# Patient Record
Sex: Male | Born: 1980 | Race: White | Hispanic: No | Marital: Married | Smoking: Current every day smoker
Health system: Southern US, Community
[De-identification: ages and names within clinical notes are randomized; demographics above are authoritative.]

## PROBLEM LIST (undated history)

## (undated) DIAGNOSIS — J45909 Unspecified asthma, uncomplicated: Secondary | ICD-10-CM

## (undated) DIAGNOSIS — F988 Other specified behavioral and emotional disorders with onset usually occurring in childhood and adolescence: Secondary | ICD-10-CM

## (undated) DIAGNOSIS — D229 Melanocytic nevi, unspecified: Secondary | ICD-10-CM

## (undated) DIAGNOSIS — K469 Unspecified abdominal hernia without obstruction or gangrene: Secondary | ICD-10-CM

## (undated) HISTORY — PX: HERNIA REPAIR: SHX51

## (undated) HISTORY — PX: KNEE SURGERY: SHX244

## (undated) HISTORY — DX: Melanocytic nevi, unspecified: D22.9

---

## 1998-05-14 ENCOUNTER — Emergency Department (HOSPITAL_COMMUNITY): Admission: EM | Admit: 1998-05-14 | Discharge: 1998-05-14 | Payer: Self-pay | Admitting: Emergency Medicine

## 1998-07-29 ENCOUNTER — Emergency Department (HOSPITAL_COMMUNITY): Admission: EM | Admit: 1998-07-29 | Discharge: 1998-07-29 | Payer: Self-pay | Admitting: Emergency Medicine

## 1999-06-04 ENCOUNTER — Emergency Department (HOSPITAL_COMMUNITY): Admission: EM | Admit: 1999-06-04 | Discharge: 1999-06-04 | Payer: Self-pay | Admitting: Emergency Medicine

## 1999-06-04 ENCOUNTER — Encounter: Payer: Self-pay | Admitting: Emergency Medicine

## 1999-06-10 ENCOUNTER — Encounter: Payer: Self-pay | Admitting: Orthopedic Surgery

## 1999-06-10 ENCOUNTER — Ambulatory Visit (HOSPITAL_COMMUNITY): Admission: RE | Admit: 1999-06-10 | Discharge: 1999-06-10 | Payer: Self-pay | Admitting: Orthopedic Surgery

## 2002-06-02 ENCOUNTER — Emergency Department (HOSPITAL_COMMUNITY): Admission: EM | Admit: 2002-06-02 | Discharge: 2002-06-03 | Payer: Self-pay | Admitting: Emergency Medicine

## 2002-06-03 ENCOUNTER — Encounter: Payer: Self-pay | Admitting: Emergency Medicine

## 2003-12-21 ENCOUNTER — Emergency Department (HOSPITAL_COMMUNITY): Admission: EM | Admit: 2003-12-21 | Discharge: 2003-12-22 | Payer: Self-pay | Admitting: Emergency Medicine

## 2004-03-13 ENCOUNTER — Emergency Department (HOSPITAL_COMMUNITY): Admission: EM | Admit: 2004-03-13 | Discharge: 2004-03-13 | Payer: Self-pay | Admitting: Emergency Medicine

## 2004-11-02 ENCOUNTER — Ambulatory Visit (HOSPITAL_COMMUNITY): Admission: RE | Admit: 2004-11-02 | Discharge: 2004-11-02 | Payer: Self-pay | Admitting: Internal Medicine

## 2008-01-05 ENCOUNTER — Emergency Department (HOSPITAL_COMMUNITY): Admission: EM | Admit: 2008-01-05 | Discharge: 2008-01-05 | Payer: Self-pay | Admitting: Emergency Medicine

## 2008-02-03 ENCOUNTER — Emergency Department (HOSPITAL_COMMUNITY): Admission: EM | Admit: 2008-02-03 | Discharge: 2008-02-03 | Payer: Self-pay | Admitting: Emergency Medicine

## 2008-02-16 ENCOUNTER — Emergency Department (HOSPITAL_COMMUNITY): Admission: EM | Admit: 2008-02-16 | Discharge: 2008-02-16 | Payer: Self-pay | Admitting: Emergency Medicine

## 2008-03-31 ENCOUNTER — Ambulatory Visit: Payer: Self-pay | Admitting: Internal Medicine

## 2009-08-13 ENCOUNTER — Emergency Department (HOSPITAL_COMMUNITY): Admission: EM | Admit: 2009-08-13 | Discharge: 2009-08-13 | Payer: Self-pay | Admitting: Emergency Medicine

## 2010-06-21 ENCOUNTER — Ambulatory Visit: Payer: Self-pay | Admitting: Internal Medicine

## 2010-06-21 DIAGNOSIS — J45909 Unspecified asthma, uncomplicated: Secondary | ICD-10-CM | POA: Insufficient documentation

## 2010-06-21 DIAGNOSIS — J309 Allergic rhinitis, unspecified: Secondary | ICD-10-CM | POA: Insufficient documentation

## 2010-06-21 DIAGNOSIS — K4090 Unilateral inguinal hernia, without obstruction or gangrene, not specified as recurrent: Secondary | ICD-10-CM | POA: Insufficient documentation

## 2010-06-21 DIAGNOSIS — F411 Generalized anxiety disorder: Secondary | ICD-10-CM | POA: Insufficient documentation

## 2010-06-21 DIAGNOSIS — F17200 Nicotine dependence, unspecified, uncomplicated: Secondary | ICD-10-CM | POA: Insufficient documentation

## 2010-06-21 LAB — CONVERTED CEMR LAB
AST: 25 units/L (ref 0–37)
Albumin: 4.3 g/dL (ref 3.5–5.2)
Alkaline Phosphatase: 96 units/L (ref 39–117)
Basophils Relative: 0.7 % (ref 0.0–3.0)
CO2: 31 meq/L (ref 19–32)
Calcium: 9.5 mg/dL (ref 8.4–10.5)
Chloride: 105 meq/L (ref 96–112)
Eosinophils Absolute: 0.1 10*3/uL (ref 0.0–0.7)
HCT: 42.5 % (ref 39.0–52.0)
Hemoglobin: 14.9 g/dL (ref 13.0–17.0)
Lymphocytes Relative: 40.5 % (ref 12.0–46.0)
MCHC: 35.2 g/dL (ref 30.0–36.0)
Neutro Abs: 3.4 10*3/uL (ref 1.4–7.7)
Potassium: 3.8 meq/L (ref 3.5–5.1)
RBC: 4.7 M/uL (ref 4.22–5.81)
Sodium: 142 meq/L (ref 135–145)
Total Protein: 7.5 g/dL (ref 6.0–8.3)

## 2010-08-02 ENCOUNTER — Emergency Department (HOSPITAL_COMMUNITY): Admission: EM | Admit: 2010-08-02 | Discharge: 2010-08-03 | Payer: Self-pay | Admitting: Emergency Medicine
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
## 2010-10-12 NOTE — Letter (Signed)
Summary: Results Follow-up Letter  [facility]  [address] [place], [place] [address]   Phone: [phone]  Fax: [phone]    06/21/2010  [address] [place], [place]  [address]  Dear Mr. Goedde,   The following are the results of your recent test(s):  Test     Result     Liver/kidney   normal CBC       normal Thyroid     normal   _________________________________________________________  Please call for an appointment as directed _________________________________________________________ _________________________________________________________ _________________________________________________________  Sincerely,  Sanda Linger MD [facility]

## 2010-11-12 ENCOUNTER — Emergency Department (HOSPITAL_BASED_OUTPATIENT_CLINIC_OR_DEPARTMENT_OTHER)
Admission: EM | Admit: 2010-11-12 | Discharge: 2010-11-12 | Disposition: A | Discharge status: Home / Self Care | Payer: Self-pay | Attending: Emergency Medicine | Admitting: Emergency Medicine

## 2010-11-12 ENCOUNTER — Emergency Department (INDEPENDENT_AMBULATORY_CARE_PROVIDER_SITE_OTHER): Payer: Self-pay

## 2010-11-12 DIAGNOSIS — S0003XA Contusion of scalp, initial encounter: Secondary | ICD-10-CM | POA: Insufficient documentation

## 2010-11-12 DIAGNOSIS — S63509A Unspecified sprain of unspecified wrist, initial encounter: Secondary | ICD-10-CM | POA: Insufficient documentation

## 2010-11-12 DIAGNOSIS — S0083XA Contusion of other part of head, initial encounter: Secondary | ICD-10-CM | POA: Insufficient documentation

## 2010-11-12 DIAGNOSIS — F17200 Nicotine dependence, unspecified, uncomplicated: Secondary | ICD-10-CM | POA: Insufficient documentation

## 2010-11-12 DIAGNOSIS — S060X0A Concussion without loss of consciousness, initial encounter: Secondary | ICD-10-CM | POA: Insufficient documentation

## 2010-11-12 DIAGNOSIS — J069 Acute upper respiratory infection, unspecified: Secondary | ICD-10-CM | POA: Insufficient documentation

## 2010-11-12 DIAGNOSIS — M25539 Pain in unspecified wrist: Secondary | ICD-10-CM

## 2010-11-23 LAB — DIC (DISSEMINATED INTRAVASCULAR COAGULATION)PANEL
INR: 0.93 (ref 0.00–1.49)
Prothrombin Time: 12.7 seconds (ref 11.6–15.2)
Smear Review: NONE SEEN

## 2010-11-23 LAB — BASIC METABOLIC PANEL
BUN: 12 mg/dL (ref 6–23)
Calcium: 9.4 mg/dL (ref 8.4–10.5)
GFR calc non Af Amer: >60 mL/min (ref >60)
Glucose, Bld: 100 mg/dL — ABNORMAL HIGH (ref 70–99)
Sodium: 140 mEq/L (ref 135–145)

## 2010-11-23 LAB — CBC
MCHC: 34 g/dL (ref 30.0–36.0)
Platelets: 225 10*3/uL (ref 150–400)
RDW: 13.8 % (ref 11.5–15.5)

## 2010-11-23 LAB — DIFFERENTIAL
Basophils Absolute: 0 10*3/uL (ref 0.0–0.1)
Basophils Relative: 0 % (ref 0–1)
Neutro Abs: 6.4 10*3/uL (ref 1.7–7.7)
Neutrophils Relative %: 67 % (ref 43–77)

## 2010-11-23 LAB — POCT CARDIAC MARKERS
CKMB, poc: <1 ng/mL — ABNORMAL LOW (ref 1.0–8.0)
CKMB, poc: <1 ng/mL — ABNORMAL LOW (ref 1.0–8.0)
Myoglobin, poc: 30.3 ng/mL (ref 12–200)
Troponin i, poc: <0.05 ng/mL (ref 0.00–0.09)

## 2011-01-25 NOTE — Assessment & Plan Note (Signed)
[facility]                            CARDIOLOGY OFFICE NOTE   CEDRIK, HEINDL                  MRN:          [id number]  DATE:03/31/2008                            DOB:          12/15/1980    IDENTIFICATION:  Mr. Lupton is a 30 year old who is self-referred for  evaluation of chest pain.   HISTORY OF PRESENT ILLNESS:  The patient was recently seen in the [facility]  Emergency Room for chest pain on March 07, 2008.  He notes that the pain  started about 9 hours before arriving to the ER.  It was  acute and  persistent.  At its maximum was 9/10 in intensity.  This lasted several  minutes and he is down to 5/5.  He did say that the pain was pleuritic.  He thinks that taking a deep breath made it worse.  The patient was seen  and evaluated, and sent home with a diagnosis of pleurisy.  Since that  time, he has had one other episode last week not as severe.  The patient  had some throbbing left arm pain not associated with chest pain.  Sometimes he will drop things secondary to discomfort.   The patient currently is active.  He runs 2-3 times per week 1.5 miles  over a 17 minute period and has not had any change in this recently.  Denies chest pain during his running.  He gets a little pressure after.   ALLERGIES:  NONE.   MEDICATIONS:  Vitamin D.   PAST MEDICAL HISTORY:  1. Seasonal allergies.  2. Anxiety.   SOCIAL HISTORY:  The patient is married, has one child.  Smokes about 5-  6 cigarettes per day for the past 9 years.  Several years ago, he used  to smoke about 2 packs per day.  Drinks a couple times per week.   FAMILY HISTORY:  Question maternal grandfather with CAD.  Family history  is not well known.   REVIEW OF SYSTEMS:  All systems reviewed negative to the above problem  except as noted above.  Cholesterol unknown.   PHYSICAL EXAMINATION:  GENERAL:  The patient is in no acute distress.  VITAL SIGNS:  Blood pressure is 108/69,  pulse 67, weight 129.  HEENT:  Normocephalic, atraumatic, EOMI, PERL.  Mucous membranes are  moist.  NECK:  JVP is normal.  No thyromegaly or bruits.  LUNGS:  Clear without rales or wheezes.  CHEST:  Tender on palpation, left parasternal particularly.  Pain is  different though than what he came into the ER with.  CARDIAC:  Regular rate and rhythm, S1-S2, no S3-S4, murmurs or clicks.  ABDOMEN:  Supple, nontender.  Normal bowel sounds.  No masses.  EXTREMITIES:  Good distal pulses.  No lower extremity edema.   DIAGNOSTICS:  A 12-lead EKG:  Normal sinus rhythm, 65 beats per minute.   IMPRESSION:  Mr. Platten is a 30 year old gentleman with chest pain.  He  had 2 bad spells.  He was seen in the emergency room for the worst and  was told he had pleurisy.  Indeed, his pain is  very atypical for  cardiac.  He runs 3 times per week and has not had a problem running, no  change in his pace, no pain during the runs.  I tend to agree that his  pain is noncardiac.   For now, I would recommend risk factor modification.  He denies reflux.  He is an active smoker and I have counseled him at length about smoking  cessation.  Indeed this may have aggravated his presentation.  I would  get a fasting lipid panel.   The patient does note and I did not state this in the review of systems,  that he snores a lot, he is tired during the day, felt like he has never  gotten good rest.  I will therefore set him up for a sleep study to  evaluate.  He does have a history of a deviated septum.  On review of  his oropharynx, there does not appear to be any significant obstruction.   I have not set a definite followup, but will be in touch with him  regarding the test results.     Pricilla Riffle, MD, [facility]  Electronically Signed    PVR/MedQ  DD: 04/01/2008  DT: 04/01/2008  Job #: [id number]

## 2011-06-08 LAB — COMPREHENSIVE METABOLIC PANEL
AST: 30
BUN: 10
CO2: 25
Calcium: 9.6
Creatinine, Ser: 0.87
GFR calc Af Amer: >60
GFR calc non Af Amer: >60

## 2011-06-08 LAB — DIFFERENTIAL
Basophils Absolute: 0
Basophils Relative: 0
Eosinophils Absolute: 0.1
Monocytes Absolute: 0.4
Monocytes Relative: 4

## 2011-06-08 LAB — CBC
HCT: 43.2
MCHC: 34.3
MCV: 90
RBC: 4.8

## 2011-06-09 LAB — D-DIMER, QUANTITATIVE: D-Dimer, Quant: <0.22

## 2011-06-09 LAB — POCT I-STAT, CHEM 8
BUN: 17
Calcium, Ion: 1.15
Chloride: 109
Creatinine, Ser: 0.9
Glucose, Bld: 86
HCT: 44
Hemoglobin: 15
Potassium: 3.5
Sodium: 141
TCO2: 23

## 2011-06-09 LAB — POCT CARDIAC MARKERS
CKMB, poc: <1 — ABNORMAL LOW
CKMB, poc: <1 — ABNORMAL LOW
Myoglobin, poc: 27
Myoglobin, poc: 37.4
Operator id: 282201
Operator id: 282201
Troponin i, poc: <0.05
Troponin i, poc: <0.05

## 2011-06-09 LAB — CBC
MCHC: 35.7
Platelets: 260
RDW: 13.6

## 2011-06-09 LAB — DIFFERENTIAL
Basophils Absolute: 0.1
Basophils Relative: 1
Neutro Abs: 3.9
Neutrophils Relative %: 50

## 2011-08-01 DIAGNOSIS — R000 Tachycardia, unspecified: Secondary | ICD-10-CM | POA: Insufficient documentation

## 2011-08-01 DIAGNOSIS — F1010 Alcohol abuse, uncomplicated: Secondary | ICD-10-CM | POA: Insufficient documentation

## 2011-08-02 ENCOUNTER — Encounter: Payer: Self-pay | Admitting: *Deleted

## 2011-08-02 ENCOUNTER — Emergency Department (HOSPITAL_COMMUNITY)
Admission: EM | Admit: 2011-08-02 | Discharge: 2011-08-02 | Disposition: A | Discharge status: Home / Self Care | Payer: Self-pay | Attending: Emergency Medicine | Admitting: Emergency Medicine

## 2011-08-02 DIAGNOSIS — F10929 Alcohol use, unspecified with intoxication, unspecified: Secondary | ICD-10-CM

## 2011-08-02 LAB — POCT I-STAT, CHEM 8
Creatinine, Ser: 1.2 mg/dL (ref 0.50–1.35)
Hemoglobin: 15.6 g/dL (ref 13.0–17.0)
Potassium: 3.5 mEq/L (ref 3.5–5.1)
Sodium: 144 mEq/L (ref 135–145)

## 2011-08-02 MED ORDER — SODIUM CHLORIDE 0.9 % IV BOLUS (SEPSIS)
1000.0000 mL | Freq: Once | INTRAVENOUS | Status: AC
  Administered 2011-08-02: 1000 mL via INTRAVENOUS
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
## 2011-08-02 NOTE — ED Notes (Signed)
Talked with Dr Manus Gunning, informed him that patient does not have a ride home. Informed that patient was alert and awake. Ok'd to proceed with discharge.

## 2011-08-02 NOTE — ED Notes (Signed)
Ambulated to xray without difficulty Resp in room for treatments.

## 2011-08-02 NOTE — ED Notes (Signed)
No complaints at present. Bus pass given
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
## 2011-08-05 ENCOUNTER — Telehealth: Payer: Self-pay | Admitting: *Deleted

## 2011-08-05 NOTE — Telephone Encounter (Signed)
Informed pt per voicemail that he will need appointment with Dr Yetta Barre

## 2011-08-05 NOTE — Telephone Encounter (Signed)
Pt called and states he was seen at ER, he states he was given Klonipin and needs prescription for this medication. I looked at hospital note but cant tell if pt was actually given RX at ER or if it was just administer by hospital while he was there. Please Advise in Dr Yetta Barre absence

## 2011-08-05 NOTE — Telephone Encounter (Signed)
Pt has been new patient of Dr Yetta Barre since October 2011;  I can find no evidence of actual prior prescription for klonopin, though this was listed on centricity as a medication, and no refill on EPIC as well  Since this is apparently not an routine refilled med monitored by Dr Yetta Barre, I would not feel comfortable prescribing a "new" medication for this pt

## 2011-10-02 ENCOUNTER — Emergency Department (HOSPITAL_BASED_OUTPATIENT_CLINIC_OR_DEPARTMENT_OTHER)
Admission: EM | Admit: 2011-10-02 | Discharge: 2011-10-02 | Disposition: A | Discharge status: Home / Self Care | Payer: Self-pay | Attending: Emergency Medicine | Admitting: Emergency Medicine

## 2011-10-02 ENCOUNTER — Encounter (HOSPITAL_BASED_OUTPATIENT_CLINIC_OR_DEPARTMENT_OTHER): Payer: Self-pay | Admitting: *Deleted

## 2011-10-02 DIAGNOSIS — N509 Disorder of male genital organs, unspecified: Secondary | ICD-10-CM | POA: Insufficient documentation

## 2011-10-02 DIAGNOSIS — F17200 Nicotine dependence, unspecified, uncomplicated: Secondary | ICD-10-CM | POA: Insufficient documentation

## 2011-10-02 DIAGNOSIS — K4090 Unilateral inguinal hernia, without obstruction or gangrene, not specified as recurrent: Secondary | ICD-10-CM | POA: Insufficient documentation

## 2011-10-02 HISTORY — DX: Unspecified abdominal hernia without obstruction or gangrene: K46.9

## 2011-10-02 NOTE — ED Notes (Signed)
Pt states that he has had a R inguinal hernia for several years and over the past few months it has been getting more painful.  However, this past Thursday, while shoveling snow, pt states that the hernia has become larger and acutely painful.  Pt states that pain radiates down into his testicles and that he has been unable to reduce hernia.  Pt states that he is having normal bm's.

## 2011-10-02 NOTE — ED Provider Notes (Signed)
History   This chart was scribed for Hilario Quarry, MD by Melba Coon. The patient was seen in room MH09/MH09 and the patient's care was started at 10:32PM.    CSN: [id number]  Arrival date & time 10/02/11  2154   First MD Initiated Contact with Patient 10/02/11 2234      Chief Complaint  Patient presents with  . Hernia    (Consider location/radiation/quality/duration/timing/severity/associated sxs/prior treatment) HPI Samuel Harrison is a 31 y.o. male who presents to the Emergency Department complaining of constant moderate to severe lower right abdominal/groin pain due to a hernia with associated right testicular pain with an onset 3 days ago. Pt has had hernia for 5-6 years, and had it checkled by surgery group in [place] but they said it was not serious enough for surgery. Hernia pain was aggravated 3 days ago while shoveling snow. Hernia area is more swollen compared to baseline. Sitting up aggravates the pain. No n/v/d or dysuria. No allergies or meds being taken.  Past Medical History  Diagnosis Date  . Hernia     Past Surgical History  Procedure Date  . Hernia repair   . Knee surgery     History reviewed. No pertinent family history.  History  Substance Use Topics  . Smoking status: Current Everyday Smoker  . Smokeless tobacco: Not on file  . Alcohol Use: Yes  Employment: works for Estate manager/land agent; heavy lifting   Review of Systems 10 Systems reviewed and are negative for acute change except as noted in the HPI.  Allergies  Review of patient's allergies indicates no known allergies.  Home Medications   Current Outpatient Rx  Name Route Sig Dispense Refill  . DOXYCYCLINE HYCLATE 100 MG PO CPEP Oral Take 100 mg by mouth 2 (two) times daily.      BP 133/74  Pulse 96  Temp(Src) 98.6 F (37 C) (Oral)  Resp 20  Ht 5\' 5"  (1.651 m)  Wt 140 lb (63.504 kg)  BMI 23.30 kg/m2  SpO2 100%  Physical Exam  Constitutional: He is oriented to  person, place, and time. He appears well-developed and well-nourished.  HENT:  Head: Normocephalic and atraumatic.  Eyes: Conjunctivae and EOM are normal. Pupils are equal, round, and reactive to light. No scleral icterus.  Neck: Normal range of motion. Neck supple. No thyromegaly present.  Cardiovascular: Normal rate, regular rhythm and normal heart sounds.  Exam reveals no gallop and no friction rub.   No murmur heard. Pulmonary/Chest: Effort normal and breath sounds normal. No stridor. He has no wheezes. He has no rales. He exhibits no tenderness.  Abdominal: Soft. He exhibits mass. He exhibits no distension. There is tenderness. There is no rebound.  Musculoskeletal: Normal range of motion. He exhibits no edema.  Lymphadenopathy:    He has no cervical adenopathy.  Neurological: He is alert and oriented to person, place, and time. Coordination normal.  Skin: Skin is warm. No rash noted. No erythema.  Psychiatric: He has a normal mood and affect. His behavior is normal.    ED Course  Procedures (including critical care time)  DIAGNOSTIC STUDIES: Oxygen Saturation is 100% on room air, normal by my interpretation.    COORDINATION OF CARE:  10:44PM - Consultation on future hernia surgery; take ibuprofen for pain; plans for d/c    Labs Reviewed - No data to display No results found.   No diagnosis found.    MDM  I personally performed the services described in this documentation, which  was scribed in my presence. The recorded information has been reviewed and considered.         Hilario Quarry, MD 10/05/11 [phone]

## 2011-10-02 NOTE — ED Notes (Signed)
Pt states he has had a hernia for 5-6 years, that has gradually gotten worse and more painful. "Visibly larger"

## 2011-11-03 ENCOUNTER — Ambulatory Visit (INDEPENDENT_AMBULATORY_CARE_PROVIDER_SITE_OTHER): Payer: Self-pay | Admitting: Surgery

## 2011-11-03 ENCOUNTER — Encounter (INDEPENDENT_AMBULATORY_CARE_PROVIDER_SITE_OTHER): Payer: Self-pay | Admitting: Surgery

## 2011-11-03 VITALS — BP 120/80 | HR 64 | Temp 98.5°F | Resp 18 | Ht 65.0 in | Wt 138.8 lb

## 2011-11-03 DIAGNOSIS — K4020 Bilateral inguinal hernia, without obstruction or gangrene, not specified as recurrent: Secondary | ICD-10-CM

## 2011-11-03 NOTE — Progress Notes (Signed)
Chief Complaint:  Bilateral inguinal hernias right greater than left  History of Present Illness:  Samuel Harrison is an 31 y.o. male who was seen in our practice a few years ago with a right inguinal hernia that he was told wasn't enough for repair. He is a smoker however and recently had a cold and with coughing not only does he have a right inguinal hernia but he has a left inguinal hernia as well. The right is however larger and does cause some discomfort. He was seen in the emergency room for this. He was to go and get this repaired. He is currently working part time. His wife accompanies him and they ranged in starting a family used to turned about sexual function. A described the operation the does not disturbing the the nerves that control sexual function.    Past Medical History  Diagnosis Date  . Hernia     Past Surgical History  Procedure Date  . Hernia repair   . Knee surgery     Current Outpatient Prescriptions  Medication Sig Dispense Refill  . doxycycline (DORYX) 100 MG DR capsule Take 100 mg by mouth 2 (two) times daily.       Review of patient's allergies indicates no known allergies. No family history on file. Social History:   reports that he has been smoking.  He does not have any smokeless tobacco history on file. He reports that he drinks alcohol. He reports that he does not use illicit drugs.   REVIEW OF SYSTEMS - PERTINENT POSITIVES ONLY: There is nasal congestion, cough, wheezing, lower abdominal pain, diarrhea, nausea, vomiting, difficulty urinating, and anxiety. He smokes about 2 packs a day of cigarettes and his wife smokes as well. He does not use drugs he drinks occasionally.  Physical Exam:   Blood pressure 120/80, pulse 64, temperature 98.5 F (36.9 C), temperature source Temporal, resp. rate 18, height 5\' 5"  (1.651 m), weight 138 lb 12.8 oz (62.959 kg). Body mass index is 23.10 kg/(m^2).  Gen:  WDWN white male NAD  Neurological: Alert and oriented  to person, place, and time. Motor and sensory function is grossly intact  Head: Normocephalic and atraumatic.  Eyes: Conjunctivae are normal. Pupils are equal, round, and reactive to light. No scleral icterus.  Neck: Normal range of motion. Neck supple. No tracheal deviation or thyromegaly present.  Cardiovascular:  SR without murmurs or gallops.  No carotid bruits Respiratory: Effort normal.  No respiratory distress. No chest wall tenderness. Breath sounds normal.  No wheezes, rales or rhonchi.  Abdomen:  Easily palpable and visible bulges the right larger than left consistent with inguinal hernias bilaterally. Both are easily reducible GU: Musculoskeletal: Normal range of motion. Extremities are nontender. No cyanosis, edema or clubbing noted Lymphadenopathy: No cervical, preauricular, postauricular or axillary adenopathy is present Skin: Skin is warm and dry. No rash noted. No diaphoresis. No erythema. No pallor. Pscyh: Normal mood and affect. Behavior is normal. Judgment and thought content normal.   LABORATORY RESULTS: No results found for this or any previous visit (from the past 48 hour(s)).  RADIOLOGY RESULTS: No results found.  Problem List: Patient Active Problem List  Diagnoses  . ANXIETY  . TOBACCO USE  . ALLERGIC RHINITIS  . ASTHMA  . INGUINAL HERNIA, RIGHT  . MEMORY LOSS  . HEADACHE    Assessment & Plan: Bilateral inguinal hernias. I've discussed laparoscopic inguinal hernia repair with him to fall back to open repair if needed. He is aware of  the risk and benefits. He would like to go and proceed to have these done. A hernial booklet was given to him and includes complications the operation not limited to hernia recurrence pain which she has now radiating down into his testicles.  We'll schedule it at [facility] under general anesthesia.    Matt B. Daphine Deutscher, MD, [facility] [phone] beeper [phone]  11/03/2011 12:11  PM

## 2011-11-03 NOTE — Patient Instructions (Addendum)
Work with your wife to try to stop smoking.

## 2012-04-24 ENCOUNTER — Emergency Department (HOSPITAL_BASED_OUTPATIENT_CLINIC_OR_DEPARTMENT_OTHER)
Admission: EM | Admit: 2012-04-24 | Discharge: 2012-04-24 | Disposition: A | Discharge status: Home / Self Care | Payer: Self-pay | Attending: Emergency Medicine | Admitting: Emergency Medicine

## 2012-04-24 ENCOUNTER — Encounter (HOSPITAL_BASED_OUTPATIENT_CLINIC_OR_DEPARTMENT_OTHER): Payer: Self-pay

## 2012-04-24 DIAGNOSIS — M5380 Other specified dorsopathies, site unspecified: Secondary | ICD-10-CM | POA: Insufficient documentation

## 2012-04-24 DIAGNOSIS — F17200 Nicotine dependence, unspecified, uncomplicated: Secondary | ICD-10-CM | POA: Insufficient documentation

## 2012-04-24 DIAGNOSIS — M62830 Muscle spasm of back: Secondary | ICD-10-CM

## 2012-04-24 MED ORDER — DIAZEPAM 5 MG PO TABS: 5.0000 mg | ORAL_TABLET | Freq: Two times a day (BID) | ORAL | Status: AC

## 2012-04-24 MED ORDER — KETOROLAC TROMETHAMINE 60 MG/2ML IM SOLN
60.0000 mg | Freq: Once | INTRAMUSCULAR | Status: AC
  Administered 2012-04-24: 60 mg via INTRAMUSCULAR
  Filled 2012-04-24: qty 2

## 2012-04-24 MED ORDER — IBUPROFEN 800 MG PO TABS: 800.0000 mg | ORAL_TABLET | Freq: Three times a day (TID) | ORAL | Status: AC

## 2012-04-24 NOTE — ED Notes (Addendum)
C/o upper/mid back pain-worse with breathing and movement-started 6 days ago-increasingly worse-denies injury

## 2012-04-28 NOTE — ED Provider Notes (Signed)
History     CSN: [id number]  Arrival date & time 04/24/12  1300   First MD Initiated Contact with Patient 04/24/12 1329      Chief Complaint  Patient presents with  . Back Pain    (Consider location/radiation/quality/duration/timing/severity/associated sxs/prior treatment) HPI History from patient. 31 year old male presents with low back pain located on the right side. He states this has been persistent over the past several days. It worsens with movement. It improves with rest. He has taken Goody's powder at home without relief of his symptoms. 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 He denies any numbness or weakness in his legs, saddle anesthesia, bowel/bladder dysfunction, difficulty walking. No abdominal pain, nausea, vomiting, urinary symptoms.  Past Medical History  Diagnosis Date  . Hernia     Past Surgical History  Procedure Date  . Hernia repair   . Knee surgery     No family history on file.  History  Substance Use Topics  . Smoking status: Current Everyday Smoker  . Smokeless tobacco: Not on file  . Alcohol Use: No      Review of Systems as per history of present illness  Allergies  Review of patient's allergies indicates no known allergies.  Home Medications   Current Outpatient Rx  Name Route Sig Dispense Refill  . DIAZEPAM 5 MG PO TABS Oral Take 1 tablet (5 mg total) by mouth 2 (two) times daily. 10 tablet 0  . DOXYCYCLINE HYCLATE 100 MG PO CPEP Oral Take 100 mg by mouth 2 (two) times daily.    . IBUPROFEN 800 MG PO TABS Oral Take 1 tablet (800 mg total) by mouth 3 (three) times daily. 21 tablet 0    BP 127/75  Pulse 92  Temp 98.4 F (36.9 C) (Oral)  Ht 5\' 6"  (1.676 m)  Wt 145 lb (65.772 kg)  BMI 23.40 kg/m2  SpO2 100%  Physical Exam  Nursing note and vitals reviewed. Constitutional: He appears well-developed and well-nourished. No distress.  HENT:    Head: Normocephalic and atraumatic.  Neck: Normal range of motion.  Cardiovascular: Normal rate, regular rhythm and normal heart sounds.   Pulmonary/Chest: Effort normal and breath sounds normal.  Abdominal: Soft. Bowel sounds are normal. There is no tenderness. There is no rebound and no guarding.  Musculoskeletal: Normal range of motion.       Spine: No palpable stepoff, crepitus, or gross deformity appreciated. Palpable spasm of the right paravertebral muscles. No midline tenderness. 5 out of 5 and equal strength in the bilateral lower extremities. Patellar reflexes 2+ bilaterally. Sensory intact to light-touch in the lower extremities bilaterally. Ambulatory with normal gait.  Neurological: He is alert.  Skin: Skin is warm and dry. He is not diaphoretic.  Psychiatric: He has a normal mood and affect.    ED Course  Procedures (including critical care time)  Labs Reviewed - No data to display No results found.   1. Paraspinal muscle spasm       MDM  Patient with palpable muscle spasm of the low back. He has no "red flags" on history or exam for back pain. He is ambulatory with normal gait. Prescriptions for ibuprofen and Valium. Gentle stretching exercises and ice discussed. Reasons to return discussed.       Grant Fontana, [place] 04/28/12 [phone]

## 2012-05-20 NOTE — ED Provider Notes (Signed)
Medical screening examination/treatment/procedure(s) were performed by non-physician practitioner and as supervising physician I was immediately available for consultation/collaboration.   Dione Booze, MD 05/20/12 1452

## 2014-03-20 ENCOUNTER — Ambulatory Visit (INDEPENDENT_AMBULATORY_CARE_PROVIDER_SITE_OTHER): Payer: Self-pay | Admitting: Surgery

## 2014-03-31 ENCOUNTER — Encounter (INDEPENDENT_AMBULATORY_CARE_PROVIDER_SITE_OTHER): Payer: Self-pay | Admitting: Surgery

## 2014-04-07 ENCOUNTER — Encounter: Payer: Self-pay | Admitting: Internal Medicine

## 2014-04-07 ENCOUNTER — Ambulatory Visit: Payer: Self-pay | Admitting: Internal Medicine

## 2014-04-07 VITALS — BP 122/72 | HR 96 | Temp 99.3°F | Resp 16 | Ht 66.0 in | Wt 145.0 lb

## 2014-04-07 DIAGNOSIS — F17200 Nicotine dependence, unspecified, uncomplicated: Secondary | ICD-10-CM

## 2014-04-07 DIAGNOSIS — H01004 Unspecified blepharitis left upper eyelid: Secondary | ICD-10-CM

## 2014-04-07 DIAGNOSIS — J45909 Unspecified asthma, uncomplicated: Secondary | ICD-10-CM

## 2014-04-07 DIAGNOSIS — F411 Generalized anxiety disorder: Secondary | ICD-10-CM

## 2014-04-07 DIAGNOSIS — K4090 Unilateral inguinal hernia, without obstruction or gangrene, not specified as recurrent: Secondary | ICD-10-CM

## 2014-04-07 DIAGNOSIS — J309 Allergic rhinitis, unspecified: Secondary | ICD-10-CM

## 2014-04-07 MED ORDER — CEPHALEXIN 500 MG PO CAPS: 500.0000 mg | ORAL_CAPSULE | Freq: Four times a day (QID) | ORAL | Status: AC

## 2014-04-07 MED ORDER — TOBRAMYCIN 0.3 % OP SOLN: OPHTHALMIC | Status: DC

## 2014-04-07 NOTE — Patient Instructions (Signed)
Blepharitis °Blepharitis is a skin problem that makes your eyelids watery, red, puffy (swollen), crusty, scaly, or painful. It may also make your eyes itch. You may lose eyelashes. °HOME CARE °· Keep your hands clean. °· Use a clean towel each time you dry your eyelids. Do not share towels or makeup with anyone. °· Carefully wash your eyelids and eyelashes 2 times a day. Use warm water and baby shampoo or just water. °· Wash your face and eyebrows at least once a day. °· Hold a folded washcloth under warm water. Squeeze the water out. Put the warm washcloth on your eyes 2 times a day for 10 minutes, or as told by your doctor. °· Apply medicated cream as told by your doctor. °· Avoid rubbing your eyes. °· Avoid wearing makeup until you get better. °· Follow up with your doctor as told. °GET HELP RIGHT AWAY IF: °· Your pain, redness, or puffiness gets worse. °· Your pain, redness, or puffiness spreads to other parts of your face. °· Your vision changes, or you have pain when looking at lights or moving objects. °· You have a fever. °· You do not get better after 2 to 4 days. °MAKE SURE YOU: °· Understand these instructions. °· Will watch your condition. °· Will get help right away if you are not doing well or get worse. °Document Released: 06/07/2008 Document Revised: 11/21/2011 Document Reviewed: 10/06/2010 °ExitCare® Patient Information ©2015 [employer]. This information is not intended to replace advice given to you by your health care provider. Make sure you discuss any questions you have with your health care provider. ° °

## 2014-04-07 NOTE — Progress Notes (Signed)
   Subjective:    Patient ID: Samuel NestleMarley Seth Harrison, male    DOB: 07/16/1981, 33 y.o.   MRN: [id number]  Eye Problem  The left eye is affected.This is a new problem. Episode onset: awoke with a swollen red and tender left upper eyelid with a mucoid discharge  The problem has been gradually worsening. The pain is mild. There is no known exposure to pink eye. He does not wear contacts. Associated symptoms include an eye discharge. Pertinent negatives include no blurred vision, double vision, fever, foreign body sensation, itching, nausea or photophobia. He has tried nothing for the symptoms.   Meds - none  All - none  Review of Systems  Constitutional: Negative for fever.  Eyes: Positive for discharge. Negative for blurred vision, double vision and photophobia.  Gastrointestinal: Negative for nausea.  Skin: Negative for itching.   Objective:   Physical Exam  Constitutional: He is oriented to person, place, and time.  In No Distress   HENT:  Right Ear: External ear normal.  Left Ear: External ear normal.  Nose: Nose normal.  Mouth/Throat: Oropharynx is clear and moist. No oropharyngeal exudate.  Eyes: Conjunctivae and EOM are normal. Pupils are equal, round, and reactive to light. Right eye exhibits no discharge. Left eye exhibits no discharge.  PERRLA Conjunctiva clear. Upper lid of left eye is pink , tender  & swollen. No discharge is evident. No preAuricular LN's evident.  Neck: Normal range of motion. Neck supple. No thyromegaly present.  Cardiovascular: Normal rate, regular rhythm and normal heart sounds.   No murmur heard. Pulmonary/Chest: Effort normal and breath sounds normal. No respiratory distress. He has no wheezes. He has no rales.  Musculoskeletal: Normal range of motion.  Lymphadenopathy:    He has no cervical adenopathy.  Neurological: He is alert and oriented to person, place, and time. No cranial nerve deficit. Coordination normal.  Skin: Skin is warm and dry. No rash  noted. No erythema. No pallor.   Assessment & Plan:    1. Blepharitis of left upper eyelid  - Rx Tobramycin 0.3% soln 10 cc Opth gtt's  - Rx - Keflex 500 mg # 28 qid pc/hs

## 2015-02-03 ENCOUNTER — Encounter: Payer: Self-pay | Admitting: Internal Medicine

## 2015-02-03 ENCOUNTER — Telehealth: Payer: Self-pay | Admitting: *Deleted

## 2015-02-03 ENCOUNTER — Ambulatory Visit (HOSPITAL_COMMUNITY)
Admission: RE | Admit: 2015-02-03 | Discharge: 2015-02-03 | Disposition: A | Discharge status: Home / Self Care | Payer: 59 | Source: Ambulatory Visit | Attending: Internal Medicine | Admitting: Internal Medicine

## 2015-02-03 ENCOUNTER — Ambulatory Visit (INDEPENDENT_AMBULATORY_CARE_PROVIDER_SITE_OTHER): Payer: 59 | Admitting: Internal Medicine

## 2015-02-03 VITALS — BP 136/86 | HR 80 | Temp 97.9°F | Resp 16 | Ht 66.0 in | Wt 147.2 lb

## 2015-02-03 DIAGNOSIS — R5383 Other fatigue: Secondary | ICD-10-CM

## 2015-02-03 DIAGNOSIS — R05 Cough: Secondary | ICD-10-CM | POA: Diagnosis present

## 2015-02-03 DIAGNOSIS — R059 Cough, unspecified: Secondary | ICD-10-CM

## 2015-02-03 DIAGNOSIS — M542 Cervicalgia: Secondary | ICD-10-CM

## 2015-02-03 DIAGNOSIS — R61 Generalized hyperhidrosis: Secondary | ICD-10-CM | POA: Diagnosis not present

## 2015-02-03 DIAGNOSIS — E559 Vitamin D deficiency, unspecified: Secondary | ICD-10-CM | POA: Insufficient documentation

## 2015-02-03 DIAGNOSIS — Z79899 Other long term (current) drug therapy: Secondary | ICD-10-CM

## 2015-02-03 DIAGNOSIS — K4020 Bilateral inguinal hernia, without obstruction or gangrene, not specified as recurrent: Secondary | ICD-10-CM

## 2015-02-03 DIAGNOSIS — B351 Tinea unguium: Secondary | ICD-10-CM

## 2015-02-03 DIAGNOSIS — Z111 Encounter for screening for respiratory tuberculosis: Secondary | ICD-10-CM

## 2015-02-03 LAB — BASIC METABOLIC PANEL WITH GFR
BUN: 14 mg/dL (ref 6–23)
CALCIUM: 9.3 mg/dL (ref 8.4–10.5)
CHLORIDE: 105 meq/L (ref 96–112)
CO2: 25 mEq/L (ref 19–32)
CREATININE: 0.92 mg/dL (ref 0.50–1.35)
GFR, Est African American: >89 mL/min
GLUCOSE: 69 mg/dL — AB (ref 70–99)
POTASSIUM: 3.9 meq/L (ref 3.5–5.3)
Sodium: 139 mEq/L (ref 135–145)

## 2015-02-03 LAB — HEPATIC FUNCTION PANEL
ALT: 30 U/L (ref 0–53)
AST: 21 U/L (ref 0–37)
Albumin: 4.8 g/dL (ref 3.5–5.2)
Alkaline Phosphatase: 85 U/L (ref 39–117)
BILIRUBIN DIRECT: 0.1 mg/dL (ref 0.0–0.3)
Indirect Bilirubin: 0.5 mg/dL (ref 0.2–1.2)
Total Bilirubin: 0.6 mg/dL (ref 0.2–1.2)
Total Protein: 7.6 g/dL (ref 6.0–8.3)

## 2015-02-03 LAB — IRON AND TIBC
%SAT: 37 % (ref 20–55)
Iron: 128 ug/dL (ref 42–165)
TIBC: 342 ug/dL (ref 215–435)
UIBC: 214 ug/dL (ref 125–400)

## 2015-02-03 LAB — MAGNESIUM: Magnesium: 2.2 mg/dL (ref 1.5–2.5)

## 2015-02-03 LAB — CBC WITH DIFFERENTIAL/PLATELET
Basophils Absolute: 0.1 10*3/uL (ref 0.0–0.1)
Basophils Relative: 1 % (ref 0–1)
Eosinophils Absolute: 0.1 10*3/uL (ref 0.0–0.7)
Eosinophils Relative: 1 % (ref 0–5)
HCT: 45.3 % (ref 39.0–52.0)
HEMOGLOBIN: 15.8 g/dL (ref 13.0–17.0)
LYMPHS PCT: 39 % (ref 12–46)
Lymphs Abs: 2.6 10*3/uL (ref 0.7–4.0)
MCH: 30.6 pg (ref 26.0–34.0)
MCHC: 34.9 g/dL (ref 30.0–36.0)
MCV: 87.6 fL (ref 78.0–100.0)
MPV: 9.7 fL (ref 8.6–12.4)
Monocytes Absolute: 0.5 10*3/uL (ref 0.1–1.0)
Monocytes Relative: 8 % (ref 3–12)
NEUTROS ABS: 3.4 10*3/uL (ref 1.7–7.7)
NEUTROS PCT: 51 % (ref 43–77)
Platelets: 250 10*3/uL (ref 150–400)
RBC: 5.17 MIL/uL (ref 4.22–5.81)
RDW: 14.1 % (ref 11.5–15.5)
WBC: 6.7 10*3/uL (ref 4.0–10.5)

## 2015-02-03 LAB — TSH: TSH: 2.091 u[IU]/mL (ref 0.350–4.500)

## 2015-02-03 LAB — HEMOGLOBIN A1C
HEMOGLOBIN A1C: 5.5 % (ref <5.7)
Mean Plasma Glucose: 111 mg/dL (ref <117)

## 2015-02-03 LAB — VITAMIN B12: Vitamin B-12: 615 pg/mL (ref 211–911)

## 2015-02-03 MED ORDER — KETOCONAZOLE 2 % EX CREA: TOPICAL_CREAM | CUTANEOUS | Status: DC

## 2015-02-03 NOTE — Progress Notes (Addendum)
Subjective:    Patient ID: Samuel Harrison, male    DOB: 11/02/1980, 34 y.o.   MRN: [id number]  HPI  This nice 34 yo MWM smoker presents with 3-4 mo hx/o intermittent drenching night sweats. He denies k/o fever, weight loss, and does report that he's been unusually tired & sleepy. He does have non productive cough. Has had some dysuria.     Patient has known bilateral inguinal hernias and requests referral for surgical evaluation.  Also he has hx/o  Prior neck pains and requests referral back to Dr Renae FicklePaul.  Medication Sig  . cetirizine (ZYRTEC) 10 MG tablet Take 10 mg by mouth daily.  Marland Kitchen. tobramycin (TOBREX) 0.3 % ophth soln 1 to 2 drops to the left eye every 2 hours today, the 4 x day starting tomorrow for 5 days, then at bedtime   No Known Allergies   Past Medical History  Diagnosis Date  . Hernia    Past Surgical History  Procedure Laterality Date  . Hernia repair    . Knee surgery     Review of Systems In addition to the HPI above,  No Fever-chills,  No Headache, No changes with Vision or hearing,  No problems swallowing food or Liquids,  No Chest pain or productive Cough or Shortness of Breath,  No Abdominal pain, No Nausea or Vomitting, Bowel movements are regular,  No Blood in stool or Urine,  (+) dysuria,  No new skin rashes or bruises,  No new joints pains-aches,  No new weakness, tingling, numbness in any extremity,  No recent weight loss,  No polyuria, polydypsia or polyphagia,  No significant Mental Stressors.  A full 10 point Review of Systems was done, except as stated above, all other Review of Systems were negative    Objective:   Physical Exam  BP 136/86 mmHg  Pulse 80  Temp(Src) 97.9 F (36.6 C)  Resp 16  Ht 5\' 6"  (1.676 m)  Wt 147 lb 3.2 oz (66.769 kg)  BMI 23.77 kg/m2  HEENT - Eac's patent. TM's Nl. EOM's full. PERRLA. NasoOroPharynx clear. Neck - supple. Nl Thyroid. Carotids 2+ & No bruits, nodes, JVD Chest -Equal BS w/o Rales or wheezes.   Few scattered coarse rhonchi. Cor - Nl HS. RRR w/o sig MGR. PP 1(+). No edema. Lymphatics - perfectly negative. Abd - No palpable organomegaly, masses or tenderness. BS nl. GU - Bilat Inguinal hernias - Rt>Lt MS- FROM w/o deformities. Muscle power, tone and bulk Nl. Gait Nl. Neuro - No obvious Cr N abnormalities. Sensory, motor and Cerebellar functions appear Nl w/o focal abnormalities. Psyche - Mental status normal & appropriate.  No delusions, ideations or obvious mood abnormalities. Skin - demonstrates tinea in the left groin.    Assessment & Plan:   1. Other fatigue  - Urine Microscopic - Urine culture - Vitamin B12 - Iron and TIBC - TSH - Hemoglobin A1c - Insulin, random  2. Unexplained night sweats  - DG Chest 2 View; Future - PPD  3. Cough  - DG Chest 2 View; Future  4. Bilateral inguinal hernia without obstruction or gangrene,  - Ambulatory referral to General Surgery  5. Vitamin D deficiency  - Vit D  25 hydroxy   6. Medication management  - CBC with Differential/Platelet - BASIC METABOLIC PANEL WITH GFR - Hepatic function panel - Magnesium  7. Tinea unguium  - ketoconazole (NIZORAL) 2 % cream; Apply to rash 2 to 3 x daily  Dispense: 60 g; Refill:  1  8. Cervicalgia -   - Refer back to Dr Renae Fickle

## 2015-02-04 LAB — URINALYSIS, MICROSCOPIC ONLY
Bacteria, UA: NONE SEEN
CASTS: NONE SEEN
CRYSTALS: NONE SEEN
Squamous Epithelial / LPF: NONE SEEN

## 2015-02-04 LAB — URINE CULTURE
COLONY COUNT: NO GROWTH
ORGANISM ID, BACTERIA: NO GROWTH

## 2015-02-04 LAB — VITAMIN D 25 HYDROXY (VIT D DEFICIENCY, FRACTURES): Vit D, 25-Hydroxy: 20 ng/mL — ABNORMAL LOW (ref 30–100)

## 2015-02-04 LAB — INSULIN, RANDOM: Insulin: 5.3 u[IU]/mL (ref 2.0–19.6)

## 2015-02-04 NOTE — Addendum Note (Signed)
Addended by: Lucky CowboyMCKEOWN, Reda Gettis on: 02/04/2015 08:45 AM   Modules accepted: Orders

## 2015-02-04 NOTE — Telephone Encounter (Signed)
ERROR

## 2015-02-05 ENCOUNTER — Telehealth: Payer: Self-pay | Admitting: *Deleted

## 2015-02-05 LAB — TB SKIN TEST
Induration: 0 mm
TB Skin Test: NEGATIVE

## 2015-02-05 NOTE — Telephone Encounter (Signed)
Pt aware of lab results.  Urine culture was negative.

## 2015-02-11 ENCOUNTER — Encounter: Payer: Self-pay | Admitting: Internal Medicine

## 2015-03-05 ENCOUNTER — Encounter: Payer: Self-pay | Admitting: Internal Medicine

## 2015-03-05 ENCOUNTER — Ambulatory Visit (INDEPENDENT_AMBULATORY_CARE_PROVIDER_SITE_OTHER): Payer: 59 | Admitting: Internal Medicine

## 2015-03-05 VITALS — BP 126/84 | HR 96 | Temp 97.9°F | Resp 16 | Ht 66.0 in | Wt 148.0 lb

## 2015-03-05 DIAGNOSIS — J069 Acute upper respiratory infection, unspecified: Secondary | ICD-10-CM

## 2015-03-05 DIAGNOSIS — M549 Dorsalgia, unspecified: Secondary | ICD-10-CM

## 2015-03-05 MED ORDER — FLUTICASONE PROPIONATE 50 MCG/ACT NA SUSP: 2.0000 | Freq: Every day | NASAL | Status: DC

## 2015-03-05 MED ORDER — BENZONATATE 200 MG PO CAPS: 200.0000 mg | ORAL_CAPSULE | Freq: Three times a day (TID) | ORAL | Status: DC | PRN

## 2015-03-05 MED ORDER — CLOTRIMAZOLE-BETAMETHASONE 1-0.05 % EX CREA: 1.0000 "application " | TOPICAL_CREAM | Freq: Two times a day (BID) | CUTANEOUS | Status: DC

## 2015-03-05 MED ORDER — PREDNISONE 20 MG PO TABS: ORAL_TABLET | ORAL | Status: DC

## 2015-03-05 MED ORDER — ALBUTEROL SULFATE HFA 108 (90 BASE) MCG/ACT IN AERS: 1.0000 | INHALATION_SPRAY | Freq: Four times a day (QID) | RESPIRATORY_TRACT | Status: DC | PRN

## 2015-03-05 MED ORDER — DOXYCYCLINE HYCLATE 100 MG PO CAPS: 100.0000 mg | ORAL_CAPSULE | Freq: Two times a day (BID) | ORAL | Status: DC

## 2015-03-05 NOTE — Patient Instructions (Addendum)
You currently have an upper respiratory infection.  Please start taking the doxycycline twice daily with food until it is gone.  Please make sure that you are taking zyrtec nightly.  You need to also use flonase or nasacort which is available over the counter nightly.  Use nasal saline as often as you can tolerate.  You can also use your albuterol inhaler as needed every six hours for severe coughing and wheezing.  Take the prednisone until it is gone.  If your breathing gets worse please let us know.  You can take vicoprofen before bed which can help with your coughing.  I will send in the new cream for your rash.  You can take tessalon as needed for your cough.

## 2015-03-05 NOTE — Progress Notes (Signed)
Patient ID: Samuel Harrison, male   DOB: 1980/10/27, 34 y.o.   MRN: [id number]  HPI  Patient presents to the office for evaluation of cough.  It has been going on for 4 days.  Patient reports all the time, wet, green and brown sputum production, he also endorses some mild blood in his sputum.  They also endorse change in voice, chills, fever, shortness of breath, wheezing and nasal congestion, post nasal drip, nausea, and green nasal mucous, ear pressure.  .  They have tried inhalers: albuterol.  They report that nothing has worked.  They denies other sick contacts.  Patient also reports rash on his groin which has been itching and spreading.  He has been using ketoconazole on it with minimal relief.  He would like to try another cream.  Review of Systems  Constitutional: Positive for malaise/fatigue. Negative for fever and chills.  HENT: Positive for congestion and ear pain. Negative for ear discharge and sore throat.   Respiratory: Positive for cough, shortness of breath and wheezing.   Cardiovascular: Negative for chest pain, palpitations and leg swelling.  Skin: Positive for rash.  Neurological: Negative for headaches.    PE:  General:  Alert and non-toxic, WDWN, NAD HEENT: NCAT, PERLA, EOM normal, no occular discharge or erythema.  Nasal mucosal edema with sinus tenderness to palpation.  Oropharynx clear with minimal oropharyngeal edema and erythema.  Mucous membranes moist and pink. Neck:  Cervical adenopathy Chest:  RRR no MRGs.  Lungs clear to auscultation A&P with no wheezes rhonchi or rales.   Abdomen: +BS x 4 quadrants, soft, non-tender, no guarding, rigidity, or rebound. Skin: warm and dry no rash, Macular red rash with excoriation and scattered erythematous papules. Neuro: A&Ox4, CN II-XII grossly intact  Assessment and Plan:   1. Acute URI  - predniSONE (DELTASONE) 20 MG tablet; 3 tabs po day one, then 2 tabs daily x 4 days  Dispense: 11 tablet; Refill: 0 - fluticasone  (FLONASE) 50 MCG/ACT nasal spray; Place 2 sprays into both nostrils daily.  Dispense: 16 g; Refill: 0 - benzonatate (TESSALON) 200 MG capsule; Take 1 capsule (200 mg total) by mouth 3 (three) times daily as needed for cough.  Dispense: 30 capsule; Refill: 1 - albuterol (PROVENTIL HFA;VENTOLIN HFA) 108 (90 BASE) MCG/ACT inhaler; Inhale 1-2 puffs into the lungs every 6 (six) hours as needed for wheezing or shortness of breath (cough).  Dispense: 1 Inhaler; Refill: 0 - doxycycline (VIBRAMYCIN) 100 MG capsule; Take 1 capsule (100 mg total) by mouth 2 (two) times daily. One po bid x 7 days  Dispense: 14 capsule; Refill: 0  2 Tinea Cruris -clotrimazole betamethasone BID

## 2015-03-18 ENCOUNTER — Other Ambulatory Visit: Payer: Self-pay | Admitting: Orthopedic Surgery

## 2015-03-18 DIAGNOSIS — M5481 Occipital neuralgia: Secondary | ICD-10-CM

## 2015-03-23 ENCOUNTER — Ambulatory Visit
Admission: RE | Admit: 2015-03-23 | Discharge: 2015-03-23 | Disposition: A | Discharge status: Home / Self Care | Payer: 59 | Source: Ambulatory Visit | Attending: Orthopedic Surgery | Admitting: Orthopedic Surgery

## 2015-03-23 DIAGNOSIS — M5481 Occipital neuralgia: Secondary | ICD-10-CM

## 2015-03-24 ENCOUNTER — Ambulatory Visit
Admission: RE | Admit: 2015-03-24 | Discharge: 2015-03-24 | Disposition: A | Discharge status: Home / Self Care | Payer: 59 | Source: Ambulatory Visit | Attending: Orthopedic Surgery | Admitting: Orthopedic Surgery

## 2015-03-24 DIAGNOSIS — M5481 Occipital neuralgia: Secondary | ICD-10-CM

## 2015-04-21 ENCOUNTER — Ambulatory Visit: Payer: 59 | Admitting: Neurology

## 2015-04-23 ENCOUNTER — Ambulatory Visit (INDEPENDENT_AMBULATORY_CARE_PROVIDER_SITE_OTHER): Payer: 59 | Admitting: Internal Medicine

## 2015-04-23 ENCOUNTER — Encounter: Payer: Self-pay | Admitting: Internal Medicine

## 2015-04-23 VITALS — BP 118/74 | HR 94 | Temp 100.0°F | Resp 16 | Ht 64.5 in | Wt 146.0 lb

## 2015-04-23 DIAGNOSIS — F988 Other specified behavioral and emotional disorders with onset usually occurring in childhood and adolescence: Secondary | ICD-10-CM

## 2015-04-23 DIAGNOSIS — J0191 Acute recurrent sinusitis, unspecified: Secondary | ICD-10-CM | POA: Diagnosis not present

## 2015-04-23 DIAGNOSIS — F909 Attention-deficit hyperactivity disorder, unspecified type: Secondary | ICD-10-CM | POA: Diagnosis not present

## 2015-04-23 MED ORDER — ONDANSETRON HCL 4 MG PO TABS: 4.0000 mg | ORAL_TABLET | Freq: Every day | ORAL | Status: DC | PRN

## 2015-04-23 MED ORDER — CETIRIZINE HCL 10 MG PO CAPS: 10.0000 mg | ORAL_CAPSULE | Freq: Every day | ORAL | Status: DC

## 2015-04-23 MED ORDER — DOXYCYCLINE HYCLATE 100 MG PO CAPS: 100.0000 mg | ORAL_CAPSULE | Freq: Two times a day (BID) | ORAL | Status: DC

## 2015-04-23 MED ORDER — MONTELUKAST SODIUM 10 MG PO TABS: 10.0000 mg | ORAL_TABLET | Freq: Every day | ORAL | Status: DC

## 2015-04-23 NOTE — Patient Instructions (Signed)
Montelukast oral tablets  What is this medicine?  MONTELUKAST (mon te LOO kast) is used to prevent and treat the symptoms of asthma. It is also used to treat allergies. Do not use for an acute asthma attack.  This medicine may be used for other purposes; ask your health care provider or pharmacist if you have questions.  COMMON BRAND NAME(S): Singulair  What should I tell my health care provider before I take this medicine?  They need to know if you have any of these conditions:  -liver disease  -an unusual or allergic reaction to montelukast, other medicines, foods, dyes, or preservatives  -pregnant or trying to get pregnant  -breast-feeding  How should I use this medicine?  This medicine should be given by mouth. Follow the directions on the prescription label. Take this medicine at the same time every day. You may take this medicine with or without meals. Do not chew the tablets. Do not stop taking your medicine unless your doctor tells you to.  Talk to your pediatrician regarding the use of this medicine in children. Special care may be needed. While this drug may be prescribed for children as young as 15 years of age for selected conditions, precautions do apply.  Overdosage: If you think you have taken too much of this medicine contact a poison control center or emergency room at once.  NOTE: This medicine is only for you. Do not share this medicine with others.  What if I miss a dose?  If you miss a dose, take it as soon as you can. If it is almost time for your next dose, take only that dose. Do not take double or extra doses.  What may interact with this medicine?  -anti-infectives like rifampin and rifabutin  -medicines for diabetes like rosiglitazone and repaglinide  -medicines for seizures like phenytoin, phenobarbital, and carbamazepine  -paclitaxel  This list may not describe all possible interactions. Give your health care provider a list of all the medicines, herbs, non-prescription drugs, or dietary  supplements you use. Also tell them if you smoke, drink alcohol, or use illegal drugs. Some items may interact with your medicine.  What should I watch for while using this medicine?  Visit your doctor or health care professional for regular checks on your progress. Tell your doctor or health care professional if your allergy or asthma symptoms do not improve. Take your medicine even when you do not have symptoms. Do not stop taking any of your medicine(s) unless your doctor tells you to.  If you have asthma, talk to your doctor about what to do in an acute asthma attack. Always have your inhaled rescue medicine for asthma attacks with you.  Patients and their families should watch for new or worsening thoughts of suicide or depression. Also watch for sudden changes in feelings such as feeling anxious, agitated, panicky, irritable, hostile, aggressive, impulsive, severely restless, overly excited and hyperactive, or not being able to sleep. Any worsening of mood or thoughts of suicide or dying should be reported to your health care professional right away.  What side effects may I notice from receiving this medicine?  Side effects that you should report to your doctor or health care professional as soon as possible:  -allergic reactions like skin rash or hives, or swelling of the face, lips, or tongue  -breathing problems  -confusion  -dark urine  -fever or infection  -flu-like symptoms  -hallucinations  -painful lumps under the skin  -pain, tingling, numbness   in the hands or feet  -sinus pain or swelling  -suicidal thoughts or other mood changes  -trouble sleeping  -unusual bleeding or bruising  -yellowing of the eyes or skin  Side effects that usually do not require medical attention (report to your doctor or health care professional if they continue or are bothersome):  -cough  -dizziness  -drowsiness  -headache  -nightmares  -stomach upset  -stuffy nose  This list may not describe all possible side effects. Call  your doctor for medical advice about side effects. You may report side effects to FDA at 1-800-FDA-1088.  Where should I keep my medicine?  Keep out of the reach of children.  Store at room temperature between 15 and 30 degrees C (59 and 86 degrees F). Protect from light and moisture. Keep this medicine in the original bottle. Throw away any unused medicine after the expiration date.  NOTE: This sheet is a summary. It may not cover all possible information. If you have questions about this medicine, talk to your doctor, pharmacist, or health care provider.  © 2015, [employer]. (2012-09-18 11:09:07)

## 2015-04-23 NOTE — Progress Notes (Signed)
   Subjective:    Patient ID: Samuel Harrison, male    DOB: [birth year], 34 y.o.   MRN: [id number]  HPI  Patient reports to the office for evaluation of referral request to see Dr. Vickey Huger.  Patient reports that previous to losing insurance he reports that she was prescribing aderrall to help with ADD.  He reports that he has been off it for a while.  He reports that he has been taking his wifes ADD medication but he wants to have his own now.  He reports that he feels like he cannot stay on task and he reports that he has been having a hard time focusing.  He reports that he woke up this morning with some runny nose, teeth chattering, chills, and fever.  He reports that he feels like he has a sinus infection.  He reports that he thinks that this is coming from the animals.  He reports that he would like his wife to go ahead and get rid of the animals.     Review of Systems  Constitutional: Positive for chills. Negative for fever and fatigue.  HENT: Positive for congestion, postnasal drip, sinus pressure, sneezing and trouble swallowing. Negative for ear pain.   Eyes: Positive for visual disturbance.  Respiratory: Negative for chest tightness and shortness of breath.   Cardiovascular: Negative for chest pain and palpitations.  Gastrointestinal: Negative for nausea, vomiting, abdominal pain, diarrhea and constipation.       Objective:   Physical Exam  Constitutional: He is oriented to person, place, and time. He appears well-developed and well-nourished. No distress.  HENT:  Head: Normocephalic and atraumatic.  Right Ear: Tympanic membrane normal.  Left Ear: Tympanic membrane normal.  Nose: Mucosal edema and sinus tenderness present. No rhinorrhea. Right sinus exhibits maxillary sinus tenderness and frontal sinus tenderness. Left sinus exhibits maxillary sinus tenderness and frontal sinus tenderness.  Mouth/Throat: Uvula is midline. No trismus in the jaw. Posterior oropharyngeal edema  and posterior oropharyngeal erythema present. No oropharyngeal exudate.  Eyes: Conjunctivae are normal. No scleral icterus.  Neck: Normal range of motion. Neck supple. No JVD present. No thyromegaly present.  Cardiovascular: Normal rate, regular rhythm, normal heart sounds and intact distal pulses.  Exam reveals no gallop and no friction rub.   No murmur heard. Pulmonary/Chest: Effort normal and breath sounds normal. No respiratory distress. He has no wheezes. He has no rales. He exhibits no tenderness.  Musculoskeletal: Normal range of motion.  Lymphadenopathy:    He has no cervical adenopathy.  Neurological: He is alert and oriented to person, place, and time.  Skin: Skin is warm and dry. He is not diaphoretic.  Psychiatric: He has a normal mood and affect. His behavior is normal. Judgment and thought content normal.  Nursing note and vitals reviewed.  Filed Vitals:   04/23/15 0919  BP: 118/74  Pulse: 94  Temp: 100 F (37.8 C)  Resp: 16          Assessment & Plan:     1. Acute recurrent sinusitis, unspecified location -doxy -singulair -flonase -zyrtec -nasal saline -zofran  2. ADD -referral to neuro as they were previously prescribing medication and he has been taking his wifes medication.  Feel that he needs further evaluation before prescribing medication.

## 2015-04-28 ENCOUNTER — Telehealth: Payer: Self-pay | Admitting: Internal Medicine

## 2015-04-28 ENCOUNTER — Other Ambulatory Visit: Payer: Self-pay | Admitting: Internal Medicine

## 2015-04-28 NOTE — Telephone Encounter (Signed)
faxed surgical clearance letter for hernia. See paper chart

## 2015-05-14 ENCOUNTER — Ambulatory Visit (INDEPENDENT_AMBULATORY_CARE_PROVIDER_SITE_OTHER): Payer: 59 | Admitting: Physician Assistant

## 2015-05-14 ENCOUNTER — Encounter: Payer: Self-pay | Admitting: Physician Assistant

## 2015-05-14 ENCOUNTER — Other Ambulatory Visit: Payer: Self-pay | Admitting: Internal Medicine

## 2015-05-14 VITALS — BP 110/64 | HR 88 | Temp 97.7°F | Resp 16 | Ht 66.0 in | Wt 149.4 lb

## 2015-05-14 DIAGNOSIS — J0191 Acute recurrent sinusitis, unspecified: Secondary | ICD-10-CM

## 2015-05-14 DIAGNOSIS — J069 Acute upper respiratory infection, unspecified: Secondary | ICD-10-CM

## 2015-05-14 DIAGNOSIS — F988 Other specified behavioral and emotional disorders with onset usually occurring in childhood and adolescence: Secondary | ICD-10-CM

## 2015-05-14 MED ORDER — LEVOFLOXACIN 500 MG PO TABS: 500.0000 mg | ORAL_TABLET | Freq: Every day | ORAL | Status: DC

## 2015-05-14 MED ORDER — PREDNISONE 20 MG PO TABS: ORAL_TABLET | ORAL | Status: DC

## 2015-05-14 MED ORDER — ALBUTEROL SULFATE HFA 108 (90 BASE) MCG/ACT IN AERS: 2.0000 | INHALATION_SPRAY | RESPIRATORY_TRACT | Status: DC | PRN

## 2015-05-14 MED ORDER — PROMETHAZINE-CODEINE 6.25-10 MG/5ML PO SYRP: 5.0000 mL | ORAL_SOLUTION | Freq: Four times a day (QID) | ORAL | Status: DC | PRN

## 2015-05-14 NOTE — Patient Instructions (Signed)
Sinusitis can be uncomfortable. People with sinusitis have congestion with yellow/green/gray discharge, sinus pain/pressure, pain around the eyes. Sinus infections almost ALWAYS stem from a viral infection and antibiotics don't work against a virus. Even when bacteria is responsible, the infections usually clear up on their own in a week or so.   PLEASE TRY TO DO OVER THE COUNTER TREATMENT AND PREDNISONE FOR 5-7 DAYS AND IF YOU ARE NOT GETTING BETTER OR GETTING WORSE THEN YOU CAN START ON AN ANTIBIOTIC GIVEN.  Can take the prednisone AT NIGHT WITH DINNER, it take 8-12 hours to start working so it will NOT affect your sleeping if you take it at night with your food!! Take two pills the first night and 1 or two pill the second night and then 1 pill the other nights.   WASH MOUTH OUT WITH WATER AFTER INHALER  Risk of antibiotic use: About 1 in 4 people who take antibiotics have side effects including stomach problems, dizziness, or rashes. Those problems clear up soon after stopping the drugs, but in rare cases antibiotics can cause severe allergic reaction. Over use of antibiotics also encourages the growth of bacteria that can't be controlled easily with drugs. That makes you more vunerable to antibiotic-resistant infections and undermines the benefits of antibiotics for others.   Waste of Money: Antibiotics often aren't very expensive, but any money spent on unnecessary drugs is money down the drain.   When are antibiotics needed? Only when symptoms last longer than a week.  Start to improve but then worsen again  -It can take up to 2 weeks to feel better.   -If you do not get better in 7-10 days (Have fever, facial pain, dental pain and swelling), then please call the office and it is now appropriate to start an antibiotic.   -Please take Tylenol or Ibuprofen for pain. -Acetaminiphen 325mg  orally every 4-6 hours for pain.  Max: 10 per day -Ibuprofen 200mg  orally every 6-8 hours for pain.   Take with food to avoid ulcers.   Max 10 per day  Please pick one of the over the counter allergy medications below and take it once daily for allergies.  Claritin or loratadine cheapest but likely the weakest  Zyrtec or certizine at night because it can make you sleepy The strongest is allegra or fexafinadine  Cheapest at [employer], [employer], [employer]  -While drinking fluids, pinch and hold nose close and swallow.  This will help open up your eustachian tubes to drain the fluid behind your ear drums. -Try steam showers to open your nasal passages.   Drink lots of water to stay hydrated and to thin mucous.  Flonase/Nasonex is to help the inflammation.  Take 2 sprays in each nostril at bedtime.  Make sure you spray towards the outside of each nostril towards the outer corner of your eye, hold nose close and tilt head back.  This will help the medication get into your sinuses.  If you do not like this medication, then use saline nasal sprays same directions as above for Flonase. Stop the medication right away if you get blurring of your vision or nose bleeds.  Sinusitis Sinusitis is redness, soreness, and inflammation of the paranasal sinuses. Paranasal sinuses are air pockets within the bones of your face (beneath the eyes, the middle of the forehead, or above the eyes). In healthy paranasal sinuses, mucus is able to drain out, and air is able to circulate through them by way of your nose. However, when your  paranasal sinuses are inflamed, mucus and air can become trapped. This can allow bacteria and other germs to grow and cause infection. Sinusitis can develop quickly and last only a short time (acute) or continue over a long period (chronic). Sinusitis that lasts for more than 12 weeks is considered chronic.  CAUSES  Causes of sinusitis include: Allergies. Structural abnormalities, such as displacement of the cartilage that separates your nostrils (deviated septum), which can decrease the air flow  through your nose and sinuses and affect sinus drainage. Functional abnormalities, such as when the small hairs (cilia) that line your sinuses and help remove mucus do not work properly or are not present. SIGNS AND SYMPTOMS  Symptoms of acute and chronic sinusitis are the same. The primary symptoms are pain and pressure around the affected sinuses. Other symptoms include: Upper toothache. Earache. Headache. Bad breath. Decreased sense of smell and taste. A cough, which worsens when you are lying flat. Fatigue. Fever. Thick drainage from your nose, which often is green and may contain pus (purulent). Swelling and warmth over the affected sinuses. DIAGNOSIS  Your health care provider will perform a physical exam. During the exam, your health care provider may: Look in your nose for signs of abnormal growths in your nostrils (nasal polyps).  Tap over the affected sinus to check for signs of infection. View the inside of your sinuses (endoscopy) using an imaging device that has a light attached (endoscope). If your health care provider suspects that you have chronic sinusitis, one or more of the following tests may be recommended: Allergy tests. Nasal culture. A sample of mucus is taken from your nose, sent to a lab, and screened for bacteria. Nasal cytology. A sample of mucus is taken from your nose and examined by your health care provider to determine if your sinusitis is related to an allergy. TREATMENT  Most cases of acute sinusitis are related to a viral infection and will resolve on their own within 10 days. Sometimes medicines are prescribed to help relieve symptoms (pain medicine, decongestants, nasal steroid sprays, or saline sprays).  However, for sinusitis related to a bacterial infection, your health care provider will prescribe antibiotic medicines. These are medicines that will help kill the bacteria causing the infection.  Rarely, sinusitis is caused by a fungal infection. In  theses cases, your health care provider will prescribe antifungal medicine. For some cases of chronic sinusitis, surgery is needed. Generally, these are cases in which sinusitis recurs more than 3 times per year, despite other treatments. HOME CARE INSTRUCTIONS  Drink plenty of water. Water helps thin the mucus so your sinuses can drain more easily. Use a humidifier. Inhale steam 3 to 4 times a day (for example, sit in the bathroom with the shower running). Apply a warm, moist washcloth to your face 3 to 4 times a day, or as directed by your health care provider. Use saline nasal sprays to help moisten and clean your sinuses. Take medicines only as directed by your health care provider. If you were prescribed either an antibiotic or antifungal medicine, finish it all even if you start to feel better. SEEK IMMEDIATE MEDICAL CARE IF: You have increasing pain or severe headaches. You have nausea, vomiting, or drowsiness. You have swelling around your face. You have vision problems. You have a stiff neck. You have difficulty breathing. MAKE SURE YOU:  Understand these instructions. Will watch your condition. Will get help right away if you are not doing well or get worse. Document Released:  08/29/2005 Document Revised: 01/13/2014 Document Reviewed: 09/13/2011 Exit[employer] Patient Information 2015 [place], [place]. This information is not intended to replace advice given to you by your health care provider. Make sure you discuss any questions you have with your health care provider.

## 2015-05-14 NOTE — Progress Notes (Signed)
   Subjective:    Patient ID: Samuel Harrison, male    DOB: [birth year], 34 y.o.   MRN: [id number]  HPI 34 y.o. smoking WM WM with history of recurrent sinusitis presents with cough. He has had cough with occ mucus, worse at night while lying down, rhinorrhea, pain behind right eye with cough, no fever, chills. He has an inhaler that helps for 1-2 hours, he is on singulair daily but not on antihistamine but he is on flonase nightly. Normal CXR 01/2015. He has inguinal hernia that he is trying to get repaired, worse with cough.   Blood pressure 110/64, pulse 88, temperature 97.7 F (36.5 C), resp. rate 16, height  (1.676 m), weight 149 lb 6.4 oz (67.767 kg).  Current Outpatient Prescriptions on File Prior to Visit  Medication Sig Dispense Refill  . Cetirizine HCl 10 MG CAPS Take 1 capsule (10 mg total) by mouth at bedtime. 30 capsule 2  . doxycycline (VIBRAMYCIN) 100 MG capsule Take 1 capsule (100 mg total) by mouth 2 (two) times daily. One po bid x 7 days 14 capsule 0  . montelukast (SINGULAIR) 10 MG tablet Take 1 tablet (10 mg total) by mouth daily. 90 tablet 1  . ondansetron (ZOFRAN) 4 MG tablet Take 1 tablet (4 mg total) by mouth daily as needed for nausea or vomiting. 30 tablet 1   No current facility-administered medications on file prior to visit.   Past Medical History  Diagnosis Date  . Hernia     Review of Systems  Constitutional: Positive for chills. Negative for fever and fatigue.  HENT: Positive for congestion, postnasal drip, sinus pressure, sneezing, sore throat and trouble swallowing. Negative for ear pain.   Eyes: Negative for visual disturbance.  Respiratory: Negative for chest tightness and shortness of breath.   Cardiovascular: Negative for chest pain and palpitations.  Gastrointestinal: Negative for nausea, vomiting, abdominal pain, diarrhea and constipation.       Objective:   Physical Exam  Constitutional: He appears well-developed and well-nourished.   HENT:  Head: Normocephalic and atraumatic.  Right Ear: External ear normal.  Left Ear: External ear normal.  Mouth/Throat: Oropharynx is clear and moist.  Eyes: Conjunctivae are normal. Pupils are equal, round, and reactive to light.  Neck: Normal range of motion. Neck supple.  Cardiovascular: Normal rate, regular rhythm and normal heart sounds.   Pulmonary/Chest: Effort normal. No respiratory distress. He has wheezes (right side). He has no rales. He exhibits no tenderness.  Abdominal: Soft. Bowel sounds are normal.  Lymphadenopathy:    He has no cervical adenopathy.  Skin: Skin is warm and dry.       Assessment & Plan:  Cough, wheezing right lower lobe Has had doxycycline twice Levaquin  QD x 10 days Prednisone  #10 Add allegra Add inhaler, continue albuterol.  May need allergy testing/referral to ENT Will release for surgery after levquin dose  Smoking cessation -  instruction/counseling given, counseled patient on the dangers of tobacco use, advised patient to stop smoking, and reviewed strategies to maximize success, would like to quit, discussed different options

## 2015-05-15 ENCOUNTER — Other Ambulatory Visit: Payer: Self-pay | Admitting: Internal Medicine

## 2015-07-31 ENCOUNTER — Other Ambulatory Visit: Payer: Self-pay | Admitting: Internal Medicine

## 2015-07-31 DIAGNOSIS — L03011 Cellulitis of right finger: Secondary | ICD-10-CM

## 2015-07-31 MED ORDER — CEPHALEXIN 500 MG PO CAPS: ORAL_CAPSULE | ORAL | Status: AC

## 2015-08-11 ENCOUNTER — Encounter: Payer: Self-pay | Admitting: Internal Medicine

## 2015-08-31 ENCOUNTER — Emergency Department (HOSPITAL_COMMUNITY)
Admission: EM | Admit: 2015-08-31 | Discharge: 2015-08-31 | Discharge status: Against Medical Advice | Payer: 59 | Attending: Emergency Medicine | Admitting: Emergency Medicine

## 2015-08-31 ENCOUNTER — Emergency Department (HOSPITAL_COMMUNITY): Payer: 59

## 2015-08-31 ENCOUNTER — Encounter (HOSPITAL_COMMUNITY): Payer: Self-pay | Admitting: Cardiology

## 2015-08-31 DIAGNOSIS — F419 Anxiety disorder, unspecified: Secondary | ICD-10-CM | POA: Diagnosis not present

## 2015-08-31 DIAGNOSIS — F17210 Nicotine dependence, cigarettes, uncomplicated: Secondary | ICD-10-CM | POA: Diagnosis not present

## 2015-08-31 DIAGNOSIS — Z8719 Personal history of other diseases of the digestive system: Secondary | ICD-10-CM | POA: Diagnosis not present

## 2015-08-31 DIAGNOSIS — Z79899 Other long term (current) drug therapy: Secondary | ICD-10-CM | POA: Diagnosis not present

## 2015-08-31 DIAGNOSIS — R110 Nausea: Secondary | ICD-10-CM | POA: Diagnosis not present

## 2015-08-31 DIAGNOSIS — R079 Chest pain, unspecified: Secondary | ICD-10-CM | POA: Diagnosis not present

## 2015-08-31 LAB — BASIC METABOLIC PANEL
Anion gap: 10 (ref 5–15)
BUN: 14 mg/dL (ref 6–20)
CO2: 26 mmol/L (ref 22–32)
CREATININE: 1.02 mg/dL (ref 0.61–1.24)
Calcium: 9.8 mg/dL (ref 8.9–10.3)
Chloride: 107 mmol/L (ref 101–111)
GFR calc Af Amer: >60 mL/min (ref >60)
GLUCOSE: 98 mg/dL (ref 65–99)
Potassium: 3.9 mmol/L (ref 3.5–5.1)
SODIUM: 143 mmol/L (ref 135–145)

## 2015-08-31 LAB — CBC
HEMATOCRIT: 46.4 % (ref 39.0–52.0)
Hemoglobin: 15.3 g/dL (ref 13.0–17.0)
MCH: 30.2 pg (ref 26.0–34.0)
MCHC: 33 g/dL (ref 30.0–36.0)
MCV: 91.7 fL (ref 78.0–100.0)
PLATELETS: 255 10*3/uL (ref 150–400)
RBC: 5.06 MIL/uL (ref 4.22–5.81)
RDW: 13.2 % (ref 11.5–15.5)
WBC: 7.7 10*3/uL (ref 4.0–10.5)

## 2015-08-31 LAB — I-STAT TROPONIN, ED: Troponin i, poc: 0 ng/mL (ref 0.00–0.08)

## 2015-08-31 NOTE — ED Notes (Signed)
Pt reports chest pain that started about 20 minutes ago, with some nausea. Denies any SOB. Denies any cardiac hx.

## 2015-08-31 NOTE — ED Notes (Addendum)
Patient ambulated to room, asked to put on a gown so we could get him hooked up to the cardiac monitor. Patient refused and requesting to leave, stating "I don't have time for this." Patient in NAD at this time. PA made aware and is at bedside.

## 2015-08-31 NOTE — ED Notes (Signed)
Samuel Harrison - PA discussed with patient the concerns and risks of leaving without being assessed and further treated. Patient states that he still wants to leave AMA.

## 2015-08-31 NOTE — Discharge Instructions (Signed)
Mr. Samuel Harrison,  Nice meeting you! Please follow-up with cardiology. Return to the emergency department if you have chest pain again, develop shortness of breath. Your decision to leave the emergency department is against medical advice. Feel better soon!  S. Lane HackerNicole Lorrane Mccay, PA-C

## 2015-08-31 NOTE — ED Notes (Signed)
Patient verbalized understanding of discharge instructions and risks/concerns of leaving AMA. Patient denies further questions or needs at this time. Patient ambulatory with steady gait, in NAD.

## 2015-08-31 NOTE — ED Provider Notes (Signed)
CSN: [id number]     Arrival date & time 08/31/15  1641 History   First MD Initiated Contact with Patient 08/31/15 1908     Chief Complaint  Patient presents with  . Chest Pain   HPI   Samuel Harrison is a 34 y.o. M with no significant PMH presenting with a 20 min history of chest pain and nausea.  He describes the pain as 6/10, sharp, midsternal, non-radiating, constant. He denies fevers, chills, abdominal pain, emesis, diarrhea, cardiac history.   Past Medical History  Diagnosis Date  . Hernia    Past Surgical History  Procedure Laterality Date  . Hernia repair    . Knee surgery     History reviewed. No pertinent family history. Social History  Substance Use Topics  . Smoking status: Current Every Day Smoker -- 1.00 packs/day    Types: Cigarettes  . Smokeless tobacco: None  . Alcohol Use: No     Comment: rarely    Review of Systems  Ten systems are reviewed and are negative for acute change except as noted in the HPI  Allergies  Review of patient's allergies indicates no known allergies.  Home Medications   Prior to Admission medications   Medication Sig Start Date End Date Taking? Authorizing Provider  albuterol (VENTOLIN HFA) 108 (90 BASE) MCG/ACT inhaler Inhale 2 puffs into the lungs every 4 (four) hours as needed for wheezing or shortness of breath. 05/14/15   Quentin MullingAmanda Collier, PA-C  Cetirizine HCl 10 MG CAPS Take 1 capsule (10 mg total) by mouth at bedtime. 04/23/15   Courtney Forcucci, PA-C  ketoconazole (NIZORAL) 2 % cream APPLY EXTERNALLY TO THE AFFECTED AREA 2 TO 3 TIMES DAILY FOR RASH 05/15/15   Quentin MullingAmanda Collier, PA-C  montelukast (SINGULAIR) 10 MG tablet Take 1 tablet (10 mg total) by mouth daily. 04/23/15 04/22/16  Courtney Forcucci, PA-C  ondansetron (ZOFRAN) 4 MG tablet Take 1 tablet (4 mg total) by mouth daily as needed for nausea or vomiting. 04/23/15 04/22/16  Courtney Forcucci, PA-C  promethazine-codeine (PHENERGAN WITH CODEINE) 6.25-10 MG/5ML syrup Take 5 mLs  by mouth every 6 (six) hours as needed for cough. Max: 20mL per day 05/14/15   Quentin MullingAmanda Collier, PA-C   BP 140/74 mmHg  Pulse 98  Temp(Src) 97.8 F (36.6 C) (Oral)  Resp 18  Ht 5\' 6"  (1.676 m)  Wt 67.586 kg  BMI 24.06 kg/m2  SpO2 99% Physical Exam  Constitutional: He appears well-developed and well-nourished. No distress.  HENT:  Head: Normocephalic and atraumatic.  Mouth/Throat: Oropharynx is clear and moist. No oropharyngeal exudate.  Eyes: Conjunctivae are normal. Pupils are equal, round, and reactive to light. Right eye exhibits no discharge. Left eye exhibits no discharge. No scleral icterus.  Neck: No tracheal deviation present.  Cardiovascular: Normal rate, regular rhythm, normal heart sounds and intact distal pulses.  Exam reveals no gallop and no friction rub.   No murmur heard. Pulmonary/Chest: Effort normal and breath sounds normal. No respiratory distress. He has no wheezes. He has no rales. He exhibits no tenderness.  Abdominal: Soft. Bowel sounds are normal. He exhibits no distension and no mass. There is no tenderness. There is no rebound and no guarding.  Musculoskeletal: He exhibits no edema.  Lymphadenopathy:    He has no cervical adenopathy.  Neurological: He is alert. Coordination normal.  Skin: Skin is warm and dry. No rash noted. He is not diaphoretic. No erythema.  Psychiatric: His behavior is normal.  He appears anxious  Nursing  note and vitals reviewed.   ED Course  Procedures Labs Review Labs Reviewed  BASIC METABOLIC PANEL  CBC  I-STAT TROPOININ, ED    Imaging Review Dg Chest 2 View  08/31/2015  CLINICAL DATA:  Chest pain EXAM: CHEST  2 VIEW COMPARISON:  Feb 03, 2015 FINDINGS: There is no edema or consolidation. The heart size and pulmonary vascularity are normal. No adenopathy. No pneumothorax. No bone lesions. IMPRESSION: No edema or consolidation. Electronically Signed   By: Bretta Bang III M.D.   On: 08/31/2015 17:17   I have personally  reviewed and evaluated these images and lab results as part of my medical decision-making.   EKG Interpretation   Date/Time:  Monday August 31 2015 16:51:03 EST Ventricular Rate:  102 PR Interval:  116 QRS Duration: 92 QT Interval:  332 QTC Calculation: 432 R Axis:   92 Text Interpretation:  Sinus tachycardia Rightward axis Borderline ECG ED  PHYSICIAN INTERPRETATION AVAILABLE IN [facility] Confirmed by TEST,  Record ([id number]) on 09/01/2015 6:57:57 AM      MDM   Final diagnoses:  Chest pain, unspecified chest pain type   Patient non-toxic but anxious appearing and VSS. Based on patient history and physical exam, most likely etiologies include ACS vs anxiety vs MSK. Less likely etiologies include Prinzmetal's/cocaine-induced angina, pericarditis/pericardial effusion, cardiac tamponade, constrictive pericarditis, myocarditis, aortic dissection, thoracic aortic aneurysm, CHF/acute pulmonary edema, pneumonia, pleuritis, pneumothorax, tension pneumothorax, pulmonary embolism, pulmonary HTN, GERD, esophageal spasm, Mallory-Weiss tear, Boerhaave syndrome, peptic ulcer diease, biliary disease, pancreatitis, osteoarthritis/radiculopathy, herpes zoster, anxiety, sickle cell chest crisis.  Patient is requesting to leave because his daughter is hungry and he is frustrated with how long the workup has taken. I offered to give his daughter a [place] sandwich but he declined and wants to leave. I explained that he is leaving against medical advice and explained the risks of leaving. He is in understanding but still wants to leave. Encouraged patient to follow-up with primary care provider or return to the emergency department if he develops chest pain again.    Melton Krebs, PA-C 09/06/15 0113  Lavera Guise, MD 09/09/15 [phone]

## 2015-08-31 NOTE — ED Notes (Signed)
Walked pt into room and asked him to take shirt off and put the gown on so RN/tech can put leads on and monitor him. Pt was uneasy about that because he was told that the DR would know something after blood work came back. Let him know that RN and/or tech would be in soon to help him make that decision.

## 2015-10-09 ENCOUNTER — Other Ambulatory Visit: Payer: Self-pay | Admitting: Internal Medicine

## 2015-10-09 DIAGNOSIS — J4521 Mild intermittent asthma with (acute) exacerbation: Secondary | ICD-10-CM

## 2015-10-09 MED ORDER — FLUTICASONE FUROATE-VILANTEROL 200-25 MCG/INH IN AEPB: 200.0000 ug | INHALATION_SPRAY | Freq: Every day | RESPIRATORY_TRACT | Status: DC

## 2015-10-09 MED ORDER — ALBUTEROL SULFATE HFA 108 (90 BASE) MCG/ACT IN AERS: INHALATION_SPRAY | RESPIRATORY_TRACT | Status: DC

## 2015-11-26 ENCOUNTER — Encounter: Payer: Self-pay | Admitting: Internal Medicine

## 2015-11-26 ENCOUNTER — Ambulatory Visit (INDEPENDENT_AMBULATORY_CARE_PROVIDER_SITE_OTHER): Payer: BLUE CROSS/BLUE SHIELD | Admitting: Internal Medicine

## 2015-11-26 VITALS — BP 106/70 | HR 108 | Temp 98.2°F | Resp 16 | Ht 65.0 in | Wt 140.0 lb

## 2015-11-26 DIAGNOSIS — J069 Acute upper respiratory infection, unspecified: Secondary | ICD-10-CM | POA: Diagnosis not present

## 2015-11-26 MED ORDER — CETIRIZINE HCL 10 MG PO CAPS: 10.0000 mg | ORAL_CAPSULE | Freq: Every day | ORAL | Status: DC

## 2015-11-26 MED ORDER — FLUTICASONE PROPIONATE 50 MCG/ACT NA SUSP: 2.0000 | Freq: Every day | NASAL | Status: DC

## 2015-11-26 MED ORDER — BENZONATATE 200 MG PO CAPS: 200.0000 mg | ORAL_CAPSULE | Freq: Three times a day (TID) | ORAL | Status: DC | PRN

## 2015-11-26 MED ORDER — AZITHROMYCIN 250 MG PO TABS: ORAL_TABLET | ORAL | Status: DC

## 2015-11-26 MED ORDER — MONTELUKAST SODIUM 10 MG PO TABS: 10.0000 mg | ORAL_TABLET | Freq: Every day | ORAL | Status: DC

## 2015-11-26 MED ORDER — PREDNISONE 20 MG PO TABS: ORAL_TABLET | ORAL | Status: DC

## 2015-11-26 NOTE — Progress Notes (Signed)
HPI  Patient presents to the office for evaluation of cough.  It has been going on for 2 weeks.  Patient reports night > day, wet, worse with lying down.  They also endorse change in voice, chills, postnasal drip and nasal congestion, sore throat, ear congestion..  They have tried mucinex.  They report that nothing has worked.  They admits to other sick contacts.  Both his daughters are sick.  Review of Systems  Constitutional: Positive for fever, chills and malaise/fatigue.  HENT: Positive for congestion, ear pain and sore throat.   Respiratory: Positive for cough. Negative for shortness of breath and wheezing.   Cardiovascular: Negative for chest pain, palpitations and leg swelling.  Skin: Negative.   Neurological: Negative for headaches.    PE:  Filed Vitals:   11/26/15 1625  BP: 106/70  Pulse: 108  Temp: 98.2 F (36.8 C)  Resp: 16    General:  Alert and non-toxic, WDWN, NAD HEENT: NCAT, PERLA, EOM normal, no occular discharge or erythema.  Nasal mucosal edema with sinus tenderness to palpation.  Oropharynx clear with minimal oropharyngeal edema and erythema.  Mucous membranes moist and pink. Neck:  Cervical adenopathy Chest:  RRR no MRGs.  Lungs clear to auscultation A&P with no wheezes rhonchi or rales.  Abdomen: +BS x 4 quadrants, soft, non-tender, no guarding, rigidity, or rebound. Skin: warm and dry no rash Neuro: A&Ox4, CN II-XII grossly intact  Assessment and Plan:   1. Acute URI -nasal saline - Cetirizine HCl 10 MG CAPS; Take 1 capsule (10 mg total) by mouth at bedtime.  Dispense: 30 capsule; Refill: 2 - montelukast (SINGULAIR) 10 MG tablet; Take 1 tablet (10 mg total) by mouth daily.  Dispense: 90 tablet; Refill: 1 - azithromycin (ZITHROMAX Z-PAK) 250 MG tablet; 2 po day one, then 1 daily x 4 days  Dispense: 6 tablet; Refill: 0 - fluticasone (FLONASE) 50 MCG/ACT nasal spray; Place 2 sprays into both nostrils daily.  Dispense: 16 g; Refill: 0 - predniSONE  (DELTASONE) 20 MG tablet; 3 tabs po day one, then 2 tabs daily x 4 days  Dispense: 11 tablet; Refill: 0

## 2016-03-01 ENCOUNTER — Ambulatory Visit (INDEPENDENT_AMBULATORY_CARE_PROVIDER_SITE_OTHER): Payer: BLUE CROSS/BLUE SHIELD | Admitting: Internal Medicine

## 2016-03-01 ENCOUNTER — Encounter: Payer: Self-pay | Admitting: Internal Medicine

## 2016-03-01 VITALS — BP 128/74 | HR 80 | Temp 98.1°F | Resp 16 | Ht 66.0 in | Wt 146.2 lb

## 2016-03-01 DIAGNOSIS — J040 Acute laryngitis: Secondary | ICD-10-CM | POA: Diagnosis not present

## 2016-03-01 DIAGNOSIS — B356 Tinea cruris: Secondary | ICD-10-CM

## 2016-03-01 DIAGNOSIS — E559 Vitamin D deficiency, unspecified: Secondary | ICD-10-CM

## 2016-03-01 DIAGNOSIS — R5383 Other fatigue: Secondary | ICD-10-CM | POA: Diagnosis not present

## 2016-03-01 DIAGNOSIS — M7652 Patellar tendinitis, left knee: Secondary | ICD-10-CM | POA: Diagnosis not present

## 2016-03-01 MED ORDER — KETOCONAZOLE 2 % EX CREA: 1.0000 "application " | TOPICAL_CREAM | Freq: Every day | CUTANEOUS | Status: DC

## 2016-03-01 MED ORDER — TRAMADOL HCL 50 MG PO TABS: ORAL_TABLET | ORAL | Status: DC

## 2016-03-01 MED ORDER — PREDNISONE 20 MG PO TABS: ORAL_TABLET | ORAL | Status: AC

## 2016-03-01 NOTE — Patient Instructions (Signed)
.   Patellar Tendinitis With Rehab Tendinitis is inflammation of a tendon. Tendonitis of the tendon below the kneecap (patella) is known as patellar tendonitis. Patellar tendonitis is also called jumper's knee. Jumper's knee is a common cause of pain below the kneecap (infrapatellar). Jumper's knee may involve a tear (strain) in the ligament. Strains are classified into three categories. Grade 1 strains cause pain, but the tendon is not lengthened. Grade 2 strains include a lengthened ligament, due to the ligament being stretched or partially ruptured. With grade 2 strains there is still function, although function may be decreased. Grade 3 strains involve a complete tear of the tendon or muscle, and function is usually impaired. Patellar tendon strains are usually grade 1 or 2.  SYMPTOMS   Pain, tenderness, swelling, warmth, or redness over the patellar tendon (just below the kneecap).  Pain and loss of strength (sometimes), with forcefully straightening the knee (especially when jumping or rising from a seated or squatting position), or bending the knee completely (squatting or kneeling).  Crackling sound (crepitation) when the tendon is moved or touched. CAUSES  Patellar tendonitis is caused by injury to the patellar tendon. The inflammation is the body's healing response. Common causes of injury include:  Stress from a sudden increase in intensity, frequency, or duration of training.  Overuse of the thigh muscles (quadriceps) and patellar tendon.  Direct hit (trauma) to the knee or patellar tendon. RISK INCREASES WITH:  Sports that require sudden, explosive quadriceps contraction, such as jumping, quick starts, or kicking.  Running sports, especially running down hills.  Poor strength and flexibility of the thigh and knee.  Flat feet. PREVENTION  Warm up and stretch properly before activity.  Allow for adequate recovery between workouts.  Maintain physical fitness:  Strength,  flexibility, and endurance.  Cardiovascular fitness.  Protect the knee joint with taping, protective strapping, bracing, or elastic compression bandage.  Wear arch supports (orthotics). PROGNOSIS  If treated properly, patellar tendonitis usually heals within 6 weeks.  RELATED COMPLICATIONS   Longer healing time if not properly treated or if not given enough time to heal.  Recurring symptoms if activity is resumed too soon, with overuse, with a direct blow, or when using poor technique.  If untreated, tendon rupture requiring surgery. TREATMENT Treatment first involves the use of ice and medicine to reduce pain and inflammation. The use of strengthening and stretching exercises may help reduce pain with activity. These exercises may be performed at home or with a therapist. Serious cases of tendonitis may require restraining the knee for 10 to 14 days to prevent stress on the tendon and to promote healing. Crutches may be used (uncommon) until you can walk without a limp. For cases in which nonsurgical treatment is unsuccessful, surgery may be advised to remove the inflamed tendon lining (sheath). Surgery is rare, and is only advised after at least 6 months of nonsurgical treatment. MEDICATION   If pain medicine is needed, nonsteroidal anti-inflammatory medicines (aspirin and ibuprofen), or other minor pain relievers (acetaminophen), are often advised.  Do not take pain medicine for 7 days before surgery.  Prescription pain relievers may be given if your caregiver thinks they are needed. Use only as directed and only as much as you need. HEAT AND COLD  Cold treatment (icing) should be applied for 10 to 15 minutes every 2 to 3 hours for inflammation and pain, and immediately after activity that aggravates your symptoms. Use ice packs or an ice massage.  Heat treatment may  be used before performing stretching and strengthening activities prescribed by your caregiver, physical therapist, or  athletic trainer. Use a heat pack or a warm water soak. SEEK MEDICAL CARE IF:  Symptoms get worse or do not improve in 2 weeks, despite treatment.  New, unexplained symptoms develop. (Drugs used in treatment may produce side effects.) EXERCISES RANGE OF MOTION (ROM) AND STRETCHING EXERCISES - Patellar Tendinitis (Jumper's Knee) These are some of the initial exercises with which you may start your rehabilitation program, until you see your caregiver again or until your symptoms are resolved. Remember:   Flexible tissue is more tolerant of the stresses placed on it during activities.  Each stretch should be held for 20 to 30 seconds.  A gentle stretching sensation should be felt. STRETCH - Hamstrings, Supine  Lie on your back. Loop a belt or towel over the ball of your right / left foot.  Straighten your right / left knee and slowly pull on the belt to raise your leg. Do not allow the right / left knee to bend. Keep your opposite leg flat on the floor.  Raise the leg until you feel a gentle stretch behind your right / left knee or thigh. Hold this position for __________ seconds. Repeat __________ times. Complete this stretch __________ times per day.  STRETCH - Hamstrings, Doorway  Lie on your back with your right / left leg extended and resting on the wall, and the opposite leg flat on the ground through the door. At first, position your bottom farther away from the wall.  Keep your right / left knee straight. If you feel a stretch behind your knee or thigh, hold this position for __________ seconds.  If you do not feel a stretch, scoot your bottom closer to the door, and hold __________ seconds. Repeat __________ times. Complete this stretch __________ times per day.  STRETCH - Hamstrings, Standing  Stand or sit and extend your right / left leg, placing your foot on a chair or foot stool.  Keep a slight arch in your low back and your hips straight forward.  Lead with your chest  and lean forward at the waist until you feel a gentle stretch in the back of your right / left knee or thigh. (When done correctly, this exercise requires leaning only a small distance.)  Hold this position for __________ seconds. Repeat __________ times. Complete this stretch __________ times per day. STRETCH - Adductors, Lunge  While standing, spread your legs, with your right / left leg behind you.  Lean away from your right / left leg by bending your opposite knee. You may rest your hands on your thigh for balance.  You should feel a stretch in your right / left inner thigh. Hold for __________ seconds. Repeat __________ times. Complete this exercise __________ times per day.  STRENGTHENING EXERCISES - Patellar Tendinitis (Jumper's Knee) These exercises may help you when beginning to rehabilitate your injury. They may resolve your symptoms with or without further involvement from your physician, physical therapist or athletic trainer. While completing these exercises, remember:   Muscles can gain both the endurance and the strength needed for everyday activities through controlled exercises.  Complete these exercises as instructed by your physician, physical therapist or athletic trainer. Increase the resistance and repetitions only as guided by your caregiver. STRENGTH - Quadriceps, Isometrics  Lie on your back with your right / left leg extended and your opposite knee bent.  Gradually tense the muscles in the front of  your right / left thigh. You should see either your kneecap slide up toward your hip or increased dimpling just above the knee. This motion will push the back of the knee down toward the floor, mat, or bed on which you are lying.  Hold the muscle as tight as you can, without increasing your pain, for __________ seconds.  Relax the muscles slowly and completely in between each repetition. Repeat __________ times. Complete this exercise __________ times per day.    STRENGTH - Quadriceps, Short Arcs  Lie on your back. Place a __________ inch towel roll under your right / left knee, so that the knee bends slightly.  Raise only your lower leg by tightening the muscles in the front of your thigh. Do not allow your thigh to rise.  Hold this position for __________ seconds. Repeat __________ times. Complete this exercise __________ times per day.  OPTIONAL ANKLE WEIGHTS: Begin with ____________________, but DO NOT exceed ____________________. Increase in 1 pound/ 0.5 kilogram increments. STRENGTH - Quadriceps, Straight Leg Raises  Quality counts! Watch for signs that the quadriceps muscle is working, to be sure you are strengthening the correct muscles and not "cheating" by substituting with healthier muscles.  Lay on your back with your right / left leg extended and your opposite knee bent.  Tense the muscles in the front of your right / left thigh. You should see either your kneecap slide up or increased dimpling just above the knee. Your thigh may even shake a bit.  Tighten these muscles even more and raise your leg 4 to 6 inches off the floor. Hold for __________ seconds.  Keeping these muscles tense, lower your leg.  Relax the muscles slowly and completely between each repetition. Repeat __________ times. Complete this exercise __________ times per day.  STRENGTH - Quadriceps, Squats  Stand in a door frame so that your feet and knees are in line with the frame.  Use your hands for balance, not support, on the frame.  Slowly lower your weight, bending at the hips and knees. Keep your lower legs upright so that they are parallel with the door frame. Squat only within the range that does not increase your knee pain. Never let your hips drop below your knees.  Slowly return upright, pushing with your legs, not pulling with your hands. Repeat __________ times. Complete this exercise __________ times per day.  STRENGTH - Quadriceps,  Step-Downs  Stand on the edge of a step stool or stair. Be prepared to use a countertop or wall for balance, if needed.  Keeping your right / left knee directly over the middle of your foot, slowly touch your opposite heel to the floor or lower step. Do not go all the way to the floor if your knee pain increases; just go as far as you can without increased discomfort. Use your right / left leg muscles, not gravity to lower your body weight.  Slowly push your body weight back up to the starting position. Repeat __________ times. Complete this exercise __________ times per day.    This information is not intended to replace advice given to you by your health care provider. Make sure you discuss any questions you have with your health care provider.   Document Released: 08/29/2005 Document Revised: 01/13/2015 Document Reviewed: 12/11/2008 Elsevier Interactive Patient Education 2016 [employer]   ++++++++++++++++++++++++++++++++++++++++++++++++++++++++ Hoarseness Hoarseness is any abnormal change in your voice.Hoarseness can make it difficult to speak. Your voice may sound raspy, breathy, or strained. Hoarseness  is caused by a problem with the vocal cords. The vocal cords are two bands of tissue inside your voice box (larynx). When you speak, your vocal cords move back and forth to create sound. The surfaces of your vocal cords need to be smooth for your voice to sound clear. Swelling or lumps on the vocal cords can cause hoarseness. Common causes of vocal cord problems include:  Upper airway infection.  A long-term cough.  Straining or overusing your voice.  Smoking.  Allergies.  Vocal cord growths.  Stomach acids that flow up from your stomach and irritate your vocal cords (gastroesophageal reflux). HOME CARE INSTRUCTIONS Watch your condition for any changes. To ease any discomfort that you feel:  Rest your voice. Do not whisper. Whispering can cause muscle strain.  Do not  speak in a loud or harsh voice that makes your hoarseness worse.  Do not use any tobacco products, including cigarettes, chewing tobacco, or electronic cigarettes. If you need help quitting, ask your health care provider.  Avoid secondhand smoke.  Do not eat foods that give you heartburn. Heartburn can make gastroesophageal reflux worse.  Do not drink coffee.  Do not drink alcohol.  Drink enough fluids to keep your urine clear or pale yellow.  Use a humidifier if the air in your home is dry. SEEK MEDICAL CARE IF:  You have hoarseness that lasts longer than 3 weeks.  You almost lose or completelylose your voice for longer than 3 days.  You have pain when you swallow or try to talk.  You feel a lump in your neck. SEEK IMMEDIATE MEDICAL CARE IF:  You have trouble swallowing.  You feel as though you are choking when you swallow.  You cough up blood or vomit blood.  You have trouble breathing.    ++++++++++++++++++++++++++++++++++++++++++++++++++++++++++++++ Jock Itch Jock itch (tinea cruris) is a fungal infection of the skin in the groin area. It is sometimes called ringworm, even though it is not caused by worms. It is caused by a fungus, which is a type of germ that thrives in dark, damp places. Jock itch causes a rash and itching in the groin and upper thigh area. It usually goes away in 2-3 weeks with treatment. CAUSES The fungus that causes jock itch may be spread by:  Touching a fungus infection elsewhere on your body--such as athlete's foot--and then touching your groin area.  Sharing towels or clothing with an infected person. RISK FACTORS Jock itch is most common in men and adolescent boys. This condition is more likely to develop from:  Being in hot, humid climates.  Wearing tight-fitting clothing or wet bathing suits for long periods of time.  Participating in sports.  Being overweight.  Having diabetes. SYMPTOMS Symptoms of jock itch may include:  A  red, pink, or brown rash in the groin area. The rash may spread to the thighs, anus, and buttocks.  Dry and scaly skin on or around the rash.  Itchiness. DIAGNOSIS Most often, a health care provider can make the diagnosis by looking at your rash. Sometimes, a scraping of the infected skin will be taken. This sample may be tested by looking at it under a microscope or by trying to grow the fungus from the sample (culture).  TREATMENT Treatment for this condition may include:  Antifungal medicine to kill the fungus. This may be in various forms:  Skin cream or ointment.  Medicine taken by mouth.  Skin cream or ointment to reduce the itching.  Compresses  or medicated powders to dry the infected skin. HOME CARE INSTRUCTIONS  Take medicines only as directed by your health care provider. Apply skin creams or ointments exactly as directed.  Wear loose-fitting clothing.  Men should wear cotton boxer shorts.  Women should wear cotton underwear.  Change your underwear every day to keep your groin dry.  Avoid hot baths.  Dry your groin area well after bathing.  Use a separate towel to dry your groin area. This will help to prevent a spreading of the infection to other areas of your body.  Do not scratch the affected area.  Do not share towels with other people. SEEK MEDICAL CARE IF:  Your rash does not improve or it gets worse after 2 weeks of treatment.  Your rash is spreading.  Your rash returns after treatment is finished.  You have a fever.  You have redness, swelling, or pain in the area around your rash.  You have fluid, blood, or pus coming from your rash.  Your have your rash for more than 4 weeks.   This information is not intended to replace advice given to you by your health care provider. Make sure you discuss any questions you have with your health care provider.   Document Released: 08/19/2002 Document Revised: 09/19/2014 Document Reviewed:  06/10/2014 Elsevier Interactive Patient Education [employer]

## 2016-03-01 NOTE — Progress Notes (Signed)
Subjective:    Patient ID: Samuel Harrison, male    DOB: Jan 26, 1981, 35 y.o.   MRN: [id number]  HPI   Patient presented wit c/o hoarseness x 3 weeks & L knee pains x 3 days. Has hx/o allergic asthma and has had some allergy sx's  With sneezing & post nasal drainage. Denies any fever, chills, rash  or putrid mucus drainage or cough. WRT his knee, he denies any injury. Patient does c/o non-specific fatigability.   Medication Sig  . albuterol (VENTOLIN HFA) 108 (90 Base) MCG/ACT inhaler Use 2 inhalations 5 minutes apart every 4 hours if needed to rescue wheezing  . Cetirizine HCl 10 MG CAPS Take 1 capsule (10 mg total) by mouth at bedtime.  . fluticasone (FLONASE) 50 MCG/ACT nasal spray Place 2 sprays into both nostrils daily.  . montelukast (SINGULAIR) 10 MG tablet Take 1 tablet (10 mg total) by mouth daily.  . benzonatate (TESSALON) 200 MG capsule Take 1 capsule (200 mg total) by mouth 3 (three) times daily as needed for cough.  Marland Kitchen amphetamine-dextroamphetamine (ADDERALL XR) 10 MG 24 hr capsule Take 10 mg by mouth daily.  Marland Kitchen amphetamine-dextroamphetamine (ADDERALL XR) 30 MG 24 hr capsule Take 30 mg by mouth daily.   No Known Allergies   Past Medical History  Diagnosis Date  . Hernia    Past Surgical History  Procedure Laterality Date  . Hernia repair    . Knee surgery     Review of Systems 10 point systems review negative except as above.    Objective:   Physical Exam  BP 128/74 mmHg  Pulse 80  Temp(Src) 98.1 F (36.7 C)  Resp 16  Ht  (1.676 m)  Wt 146 lb 3.2 oz (66.316 kg)  BMI 23.61 kg/m2  Speech - sl hoarse. No sig cough and no stridor.   HEENT - Eac's patent. TM's Nl. EOM's full. PERRLA. NasoOroPharynx clear. Neck - supple. Nl Thyroid. Carotids 2+ & No bruits, nodes, JVD Chest - Clear equal BS w/o Rales, rhonchi, wheezes. Cor - Nl HS. RRR w/o sig MGR. PP 1(+). No edema. MS- FROM w/o deformities. Muscle power, tone and bulk Nl. Gait Nl. Sl tender over the  prepatellar tendon & the patella.  Gu - has apparent tinea cruris in the left lighscrotal inguinal fols.  Neuro - No obvious Cr N abnormalities. Sensory, motor and Cerebellar functions appear Nl w/o focal abnormalities. Psyche - Mental status normal & appropriate.  No delusions, ideations or obvious mood abnormalities.    Assessment & Plan:   1. Laryngitis, prob allergic  - discouraged cigarette smoking. - predniSONE (DELTASONE) 20 MG tablet; 1 tab 3 x day for 3 days, then 1 tab 2 x day for 3 days, then 1 tab 1 x day for 5 days  Dispense: 20 tablet; Refill: 1  2. Patellar tendinitis of left knee  - predniSONE (DELTASONE) 20 MG tablet; 1 tab 3 x day for 3 days, then 1 tab 2 x day for 3 days, then 1 tab 1 x day for 5 days  Dispense: 20 tablet; Refill: 1 - traMADol (ULTRAM) 50 MG tablet; Take 1 tablet every 4 hours as needed for pain  Dispense: 50 tablet; Refill: 0  3. Tinea cruris  - ketoconazole (NIZORAL) 2 % cream; Apply 1 application topically daily. Apply to Jock Itch 3 to 4  X / day  Dispense: 60 g; Refill: 1  4. Other fatigue  - CBC with Differential/Platelet - BASIC METABOLIC PANEL WITH  GFR - Hepatic function panel - TSH - Hemoglobin A1c - discussed prudent diet and stretching exercises.  5. Vitamin D deficiency  - VITAMIN D 25 Hydroxy

## 2016-10-03 ENCOUNTER — Other Ambulatory Visit: Payer: Self-pay | Admitting: Internal Medicine

## 2016-11-18 ENCOUNTER — Other Ambulatory Visit: Payer: Self-pay | Admitting: Internal Medicine

## 2017-01-11 ENCOUNTER — Encounter: Payer: Self-pay | Admitting: Physician Assistant

## 2017-01-11 ENCOUNTER — Ambulatory Visit (INDEPENDENT_AMBULATORY_CARE_PROVIDER_SITE_OTHER): Payer: Self-pay | Admitting: Physician Assistant

## 2017-01-11 VITALS — BP 110/80 | HR 86 | Temp 97.5°F | Resp 16 | Ht 66.0 in | Wt 142.2 lb

## 2017-01-11 DIAGNOSIS — Z720 Tobacco use: Secondary | ICD-10-CM

## 2017-01-11 DIAGNOSIS — J0100 Acute maxillary sinusitis, unspecified: Secondary | ICD-10-CM

## 2017-01-11 MED ORDER — VARENICLINE TARTRATE 1 MG PO TABS: 1.0000 mg | ORAL_TABLET | Freq: Two times a day (BID) | ORAL | 3 refills | Status: DC

## 2017-01-11 MED ORDER — AZITHROMYCIN 250 MG PO TABS: ORAL_TABLET | ORAL | 1 refills | Status: AC

## 2017-01-11 MED ORDER — VILAZODONE HCL 10 MG PO TABS: 10.0000 mg | ORAL_TABLET | Freq: Every day | ORAL | 3 refills | Status: DC

## 2017-01-11 MED ORDER — FLUOXETINE HCL 10 MG PO CAPS: 10.0000 mg | ORAL_CAPSULE | Freq: Every day | ORAL | 2 refills | Status: DC

## 2017-01-11 MED ORDER — ALBUTEROL SULFATE HFA 108 (90 BASE) MCG/ACT IN AERS: INHALATION_SPRAY | RESPIRATORY_TRACT | 99 refills | Status: AC

## 2017-01-11 NOTE — Progress Notes (Signed)
   Subjective:    Patient ID: Samuel Harrison, male    DOB: [birth year], 36 y.o.   MRN: [id number]  HPI 36 y.o. smoking WM presents with sinus infection x 2 weeks. Had had allergies x 2 weeks, started on old doxy 4 days ago but it is not helping. He is on singulair, flonase, not on allergy pill. No fever or chills x 2 days.   Going to psych, was on viibryd. Can not afford anymore. Continue to smoke but would like to quit  Blood pressure 110/80, pulse 86, temperature 97.5 F (36.4 C), resp. rate 16, height  (1.676 m), weight 142 lb 3.2 oz (64.5 kg), SpO2 98 %.  Medications Current Outpatient Prescriptions on File Prior to Visit  Medication Sig  . Amphetamine Sulfate (EVEKEO) 10 MG TABS Take 1 tablet by mouth daily.  . fluticasone (FLONASE) 50 MCG/ACT nasal spray Place 2 sprays into both nostrils daily.  . traMADol (ULTRAM) 50 MG tablet Take 1 tablet every 4 hours as needed for pain  . albuterol (VENTOLIN HFA) 108 (90 Base) MCG/ACT inhaler Use 2 inhalations 5 minutes apart every 4 hours if needed to rescue wheezing  . montelukast (SINGULAIR) 10 MG tablet Take 1 tablet (10 mg total) by mouth daily.   No current facility-administered medications on file prior to visit.     Problem list He has ANXIETY; TOBACCO USE; ALLERGIC RHINITIS; ASTHMA; INGUINAL HERNIA, RIGHT; Bilateral inguinal hernia; and Vitamin D deficiency on his problem list.   Review of Systems  Constitutional: Positive for chills. Negative for fatigue and fever.  HENT: Positive for congestion, postnasal drip, sinus pressure, sneezing and trouble swallowing. Negative for ear pain.   Eyes: Positive for visual disturbance.  Respiratory: Negative for chest tightness and shortness of breath.   Cardiovascular: Negative for chest pain and palpitations.  Gastrointestinal: Negative for abdominal pain, constipation, diarrhea, nausea and vomiting.       Objective:   Physical Exam  Constitutional: He appears well-developed  and well-nourished.  HENT:  Head: Normocephalic and atraumatic.  Right Ear: External ear normal.  Left Ear: External ear normal.  Mouth/Throat: Oropharynx is clear and moist.  Eyes: Conjunctivae are normal. Pupils are equal, round, and reactive to light.  Neck: Normal range of motion. Neck supple.  Cardiovascular: Normal rate, regular rhythm and normal heart sounds.   Pulmonary/Chest: Effort normal. No respiratory distress. He has wheezes (right side). He has no rales. He exhibits no tenderness.  Abdominal: Soft. Bowel sounds are normal.  Lymphadenopathy:    He has no cervical adenopathy.  Skin: Skin is warm and dry.      Assessment & Plan:  1. Acute maxillary sinusitis, recurrence not specified - azithromycin (ZITHROMAX) 250 MG tablet; Take 2 tablets (500 mg) on  Day 1,  followed by 1 tablet (250 mg) once daily on Days 2 through 5.  Dispense: 6 each; Refill: 1 - albuterol (VENTOLIN HFA) 108 (90 Base) MCG/ACT inhaler; Use 2 inhalations 5 minutes apart every 4 hours if needed to rescue wheezing  Dispense: 18 g; Refill: 99  2. Tobacco abuse - varenicline (CHANTIX CONTINUING MONTH PAK) 1 MG tablet; Take 1 tablet (1 mg total) by mouth 2 (two) times daily.  Dispense: 56 tablet; Refill: 3  3. Depression Try prozac  Follow up 4-6 weeks

## 2017-01-11 NOTE — Patient Instructions (Addendum)
4 tablets daily for 3 days, 2 tablet daily for 4 days.  If you have a smart phone, please look up Smoke Free app, this will help you stay on track and give you information about money you have saved, life that you have gained back and a ton of more information.   We are giving you chantix for smoking cessation. You can do it! And we are here to help! You may have heard some scary side effects about chantix, the three most common I hear about are nausea, crazy dreams and depression.  However, I like for my patients to try to stay on 1/2 a tablet twice a day rather than one tablet twice a day as normally prescribed. This helps decrease the chances of side effects and helps save money by making a one month prescription last two months  Please start the prescription this way:  Start 1/2 tablet by mouth once daily after food with a full glass of water for 3 days Then do 1/2 tablet by mouth twice daily for 4 days. During this first week you can smoke, but try to stop after this week.  At this point we have several options: 1) continue on 1/2 tablet twice a day- which I encourage you to do. You can stay on this dose the rest of the time on the medication or if you still feel the need to smoke you can do one of the two options below. 2) do one tablet in the morning and 1/2 in the evening which helps decrease dreams. 3) do one tablet twice a day.   What if I miss a dose? If you miss a dose, take it as soon as you can. If it is almost time for your next dose, take only that dose. Do not take double or extra doses.  What should I watch for while using this medicine? Visit your doctor or health care professional for regular check ups. Ask for ongoing advice and encouragement from your doctor or healthcare professional, friends, and family to help you quit. If you smoke while on this medication, quit again  Your mouth may get dry. Chewing sugarless gum or hard candy, and drinking plenty of water may help.  Contact your doctor if the problem does not go away or is severe.  You may get drowsy or dizzy. Do not drive, use machinery, or do anything that needs mental alertness until you know how this medicine affects you. Do not stand or sit up quickly, especially if you are an older patient.   The use of this medicine may increase the chance of suicidal thoughts or actions. Pay special attention to how you are responding while on this medicine. Any worsening of mood, or thoughts of suicide or dying should be reported to your health care professional right away.  ADVANTAGES OF QUITTING SMOKING  Within 20 minutes, blood pressure decreases. Your pulse is at normal level.  After 8 hours, carbon monoxide levels in the blood return to normal. Your oxygen level increases.  After 24 hours, the chance of having a heart attack starts to decrease. Your breath, hair, and body stop smelling like smoke.  After 48 hours, damaged nerve endings begin to recover. Your sense of taste and smell improve.  After 72 hours, the body is virtually free of nicotine. Your bronchial tubes relax and breathing becomes easier.  After 2 to 12 weeks, lungs can hold more air. Exercise becomes easier and circulation improves.  After 1 year, the  risk of coronary heart disease is cut in half.  After 5 years, the risk of stroke falls to the same as a nonsmoker.  After 10 years, the risk of lung cancer is cut in half and the risk of other cancers decreases significantly.  After 15 years, the risk of coronary heart disease drops, usually to the level of a nonsmoker.  You will have extra money to spend on things other than cigarettes.  HOW TO TREAT VIRAL COUGH AND COLD SYMPTOMS:  -Symptoms usually last at least 1 week with the worst symptoms being around day 4.  - colds usually start with a sore throat and end with a cough, and the cough can take 2 weeks to get better.  -No antibiotics are needed for colds, flu, sore throats,  cough, bronchitis UNLESS symptoms are longer than 7 days OR if you are getting better then get drastically worse.  -There are a lot of combination medications (Dayquil, Nyquil, Vicks 44, tyelnol cold and sinus, ETC). Please look at the ingredients on the back so that you are treating the correct symptoms and not doubling up on medications/ingredients.    Medicines you can use  Nasal congestion  - pseudoephedrine (Sudafed)- behind the counter, do not use if you have high blood pressure, medicine that have -D in them.  - phenylephrine (Sudafed PE) -Dextormethorphan + chlorpheniramine (Coridcidin HBP)- okay if you have high blood pressure -Oxymetazoline (Afrin) nasal spray- LIMIT to 3 days -Saline nasal spray -Neti pot (used distilled or bottled water)  Ear pain/congestion  -pseudoephedrine (sudafed) - Nasonex/flonase nasal spray  Fever  -Acetaminophen (Tyelnol) -Ibuprofen (Advil, motrin, aleve)  Sore Throat  -Acetaminophen (Tyelnol) -Ibuprofen (Advil, motrin, aleve) -Drink a lot of water -Gargle with salt water - Rest your voice (don't talk) -Throat sprays -Cough drops  Body Aches  -Acetaminophen (Tyelnol) -Ibuprofen (Advil, motrin, aleve)  Headache  -Acetaminophen (Tyelnol) -Ibuprofen (Advil, motrin, aleve) - Exedrin, Exedrin Migraine  Allergy symptoms (cough, sneeze, runny nose, itchy eyes) -Claritin or loratadine cheapest but likely the weakest  -Zyrtec or certizine at night because it can make you sleepy -The strongest is allegra or fexafinadine  Cheapest at [employer], [employer], [employer]  Cough  -Dextromethorphan (Delsym)- medicine that has DM in it -Guafenesin (Mucinex/Robitussin) - cough drops - drink lots of water  Chest Congestion  -Guafenesin (Mucinex/Robitussin)  Red Itchy Eyes  - Naphcon-A  Upset Stomach  - Bland diet (nothing spicy, greasy, fried, and high acid foods like tomatoes, oranges, berries) -OKAY- cereal, bread, soup, crackers, rice -Eat smaller  more frequent meals -reduce caffeine, no alcohol -Loperamide (Imodium-AD) if diarrhea -Prevacid for heart burn  General health when sick  -Hydration -wash your hands frequently -keep surfaces clean -change pillow cases and sheets often -Get fresh air but do not exercise strenuously -Vitamin D, double up on it - Vitamin C -Zinc

## 2017-02-14 NOTE — Progress Notes (Deleted)
   Assessment and Plan:    HPI 36 y.o.male presents for follow up. He was started on prozac last visit.   Past Medical History:  Diagnosis Date  . Hernia      No Known Allergies  Current Outpatient Prescriptions on File Prior to Visit  Medication Sig  . albuterol (VENTOLIN HFA) 108 (90 Base) MCG/ACT inhaler Use 2 inhalations 5 minutes apart every 4 hours if needed to rescue wheezing  . Amphetamine Sulfate (EVEKEO) 10 MG TABS Take 1 tablet by mouth daily.  Marland Kitchen. FLUoxetine (PROZAC) 10 MG capsule Take 1 capsule (10 mg total) by mouth daily.  . fluticasone (FLONASE) 50 MCG/ACT nasal spray Place 2 sprays into both nostrils daily.  . montelukast (SINGULAIR) 10 MG tablet Take 1 tablet (10 mg total) by mouth daily.  . traMADol (ULTRAM) 50 MG tablet Take 1 tablet every 4 hours as needed for pain  . varenicline (CHANTIX CONTINUING MONTH PAK) 1 MG tablet Take 1 tablet (1 mg total) by mouth 2 (two) times daily.  . Vilazodone HCl (VIIBRYD) 10 MG TABS Take 1 tablet (10 mg total) by mouth daily.   No current facility-administered medications on file prior to visit.     ROS: all negative except above.   Physical Exam: There were no vitals filed for this visit. There were no vitals taken for this visit. General Appearance: Well nourished, in no apparent distress. Eyes: PERRLA, EOMs, conjunctiva no swelling or erythema Sinuses: No Frontal/maxillary tenderness ENT/Mouth: Ext aud canals clear, TMs without erythema, bulging. No erythema, swelling, or exudate on post pharynx.  Tonsils not swollen or erythematous. Hearing normal.  Neck: Supple, thyroid normal.  Respiratory: Respiratory effort normal, BS equal bilaterally without rales, rhonchi, wheezing or stridor.  Cardio: RRR with no MRGs. Brisk peripheral pulses without edema.  Abdomen: Soft, + BS.  Non tender, no guarding, rebound, hernias, masses. Lymphatics: Non tender without lymphadenopathy.  Musculoskeletal: Full ROM, 5/5 strength, normal  gait.  Skin: Warm, dry without rashes, lesions, ecchymosis.  Neuro: Cranial nerves intact. Normal muscle tone, no cerebellar symptoms. Sensation intact.  Psych: Awake and oriented X 3, normal affect, Insight and Judgment appropriate.     Quentin MullingAmanda Afomia Blackley, PA-C 1:06 PM [facility]

## 2017-02-15 ENCOUNTER — Ambulatory Visit: Payer: Self-pay | Admitting: Physician Assistant

## 2017-08-25 ENCOUNTER — Other Ambulatory Visit: Payer: Self-pay

## 2017-08-25 ENCOUNTER — Emergency Department (HOSPITAL_COMMUNITY)
Admission: EM | Admit: 2017-08-25 | Discharge: 2017-08-25 | Disposition: A | Discharge status: Home / Self Care | Payer: Self-pay | Attending: Emergency Medicine | Admitting: Emergency Medicine

## 2017-08-25 ENCOUNTER — Encounter (HOSPITAL_COMMUNITY): Payer: Self-pay | Admitting: *Deleted

## 2017-08-25 DIAGNOSIS — J45909 Unspecified asthma, uncomplicated: Secondary | ICD-10-CM | POA: Insufficient documentation

## 2017-08-25 DIAGNOSIS — R112 Nausea with vomiting, unspecified: Secondary | ICD-10-CM | POA: Insufficient documentation

## 2017-08-25 DIAGNOSIS — R51 Headache: Secondary | ICD-10-CM | POA: Insufficient documentation

## 2017-08-25 DIAGNOSIS — F17210 Nicotine dependence, cigarettes, uncomplicated: Secondary | ICD-10-CM | POA: Insufficient documentation

## 2017-08-25 DIAGNOSIS — H538 Other visual disturbances: Secondary | ICD-10-CM | POA: Insufficient documentation

## 2017-08-25 DIAGNOSIS — M542 Cervicalgia: Secondary | ICD-10-CM | POA: Insufficient documentation

## 2017-08-25 DIAGNOSIS — M7918 Myalgia, other site: Secondary | ICD-10-CM

## 2017-08-25 MED ORDER — CYCLOBENZAPRINE HCL 10 MG PO TABS
10.0000 mg | ORAL_TABLET | Freq: Once | ORAL | Status: AC
  Administered 2017-08-25: 10 mg via ORAL
  Filled 2017-08-25: qty 1

## 2017-08-25 MED ORDER — CYCLOBENZAPRINE HCL 10 MG PO TABS: 10.0000 mg | ORAL_TABLET | Freq: Two times a day (BID) | ORAL | 0 refills | Status: DC | PRN

## 2017-08-25 MED ORDER — OXYCODONE-ACETAMINOPHEN 5-325 MG PO TABS: 1.0000 | ORAL_TABLET | ORAL | 0 refills | Status: DC | PRN

## 2017-08-25 MED ORDER — PREDNISONE 10 MG PO TABS: ORAL_TABLET | ORAL | 0 refills | Status: DC

## 2017-08-25 MED ORDER — OXYCODONE-ACETAMINOPHEN 5-325 MG PO TABS
1.0000 | ORAL_TABLET | ORAL | Status: DC | PRN
  Administered 2017-08-25: 1 via ORAL
  Filled 2017-08-25: qty 1

## 2017-08-25 MED ORDER — PREDNISONE 20 MG PO TABS
60.0000 mg | ORAL_TABLET | Freq: Once | ORAL | Status: AC
  Administered 2017-08-25: 60 mg via ORAL
  Filled 2017-08-25: qty 3

## 2017-08-25 MED ORDER — OXYCODONE-ACETAMINOPHEN 5-325 MG PO TABS
1.0000 | ORAL_TABLET | Freq: Once | ORAL | Status: DC
  Filled 2017-08-25: qty 1

## 2017-08-25 NOTE — ED Notes (Signed)
ED Provider at bedside. 
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
## 2019-01-23 ENCOUNTER — Encounter: Payer: Self-pay | Admitting: Internal Medicine

## 2019-01-23 ENCOUNTER — Ambulatory Visit: Payer: Self-pay | Admitting: Internal Medicine

## 2019-01-23 ENCOUNTER — Ambulatory Visit (INDEPENDENT_AMBULATORY_CARE_PROVIDER_SITE_OTHER): Payer: Medicaid Other | Admitting: Internal Medicine

## 2019-01-23 ENCOUNTER — Other Ambulatory Visit: Payer: Self-pay

## 2019-01-23 VITALS — BP 104/70 | HR 88 | Temp 97.4°F | Resp 16 | Ht 64.5 in | Wt 148.2 lb

## 2019-01-23 DIAGNOSIS — B070 Plantar wart: Secondary | ICD-10-CM

## 2019-01-23 DIAGNOSIS — M79671 Pain in right foot: Secondary | ICD-10-CM

## 2019-01-23 NOTE — Progress Notes (Signed)
   Subjective:    Patient ID: Samuel Harrison, male    DOB: March 07, 1981, 38 y.o.   MRN: [id number]  HPI  Patient is a nice 38 yo MWM presenting with 2 week hx/o of pain in the lateral metatarsal area of the Right foot which has been painful to walk or bear weight.   Medication Sig  . amphetamine-dextroamphetamine (ADDERALL) 20 MG tablet Take 20 mg by mouth daily.  Marland Kitchen OVER THE COUNTER  OTC allergy tablet PRN  . albuterol (VENTOLIN HFA) 108 (90 Base) MCG/ACT inhaler Use 2 inhalations 5 minutes apart every 4 hours if needed to rescue wheezing   No Known Allergies   Past Medical History:  Diagnosis Date  . Hernia    Past Surgical History:  Procedure Laterality Date  . HERNIA REPAIR    . KNEE SURGERY     Review of Systems    10 point systems review negative except as above.     Objective:   Physical Exam   BP 104/70   Pulse 88   Temp (!) 97.4 F (36.3 C)   Resp 16   Ht 5' 4.5" (1.638 m)   Wt 148 lb 3.2 oz (67.2 kg)   BMI 25.05 kg/m   HEENT - WNL. Neck - supple.  Chest - Clear equal BS. Cor - Nl HS. RRR w/o sig MGR. PP 1(+). No edema. MS- FROM w/o deformities.  Gait Nl. Neuro -  Nl w/o focal abnormalities. Skin - there is a tender keratotic area over the Rt 4th MT sole which appears to be a plantar wart.   Procedure (CPT - Electrosurgery - 17000)     After informed consent and aseptic prep with alcohol and local anesthesia with 3 ml of lidocaine 1% the area was sharply excised full thickness with a # 10 scalpel removing a 10 mm full thickness button and then the wound base was deeply hyfrecated for hemostasis and fulguration of the remnant lesion. Then the wound was painted with new skin - then allowed to dry and antibiotic ung applied, covered with a 2" x 2" guaze and a 4" x 6" Tegaderm and then secured with 1" paper tape. Patient was instructed in po wound care advising him that total healing & wound closure could take up to 3-4 weeks.     Assessment & Plan:   1. Foot  pain, right  - advised to use Tylenol or Advil / Aleve for pain  2. Plantar wart, right foot  - instructed patient in post -op wound care

## 2019-01-23 NOTE — Patient Instructions (Signed)
Warts    Warts are small growths on the skin. They are common and can occur on many areas of the body. A person may have one wart or several warts.  In many cases, warts do not require treatment. They usually go away on their own over a period of many months to a few years. If needed, warts that cause problems or do not go away on their own can be treated.  What are the causes?  Warts are caused by a type of virus that is called human papillomavirus (HPV).  · This virus can spread from person to person through direct contact.  · Warts can also spread to other areas of the body when a person scratches a wart and then scratches another area of his or her body.  What increases the risk?  You are more likely to develop this condition if:  · You are 10-20 years old.  · You have a weakened body defense system (immune system).  · You are Caucasian.  What are the signs or symptoms?  The main symptom of this condition is small growths on the skin. Warts may:  · Be round or oval or have an irregular shape.  · Have a rough surface.  · Range in color from skin color to light yellow, brown, or gray.  · Generally be less than ½ inch (1.3 cm) in size.  · Go away and then come back again.  Most warts are painless, but some can be painful if they are large or occur in an area of the body where pressure will be applied to them, such as the bottom of the foot.  How is this diagnosed?  A wart can usually be diagnosed based on its appearance. In some cases, a tissue sample may be removed (biopsy) to be looked at under a microscope.  How is this treated?  In many cases, warts do not need treatment. Sometimes treatment is desired. If treatment is needed or desired, options may include:  · Applying medicated solutions, creams, or patches to the wart. These may be over-the-counter or prescription medicines that make the skin soft so that layers will gradually shed away. In many cases, the medicine is applied one or two times per day and  covered with a bandage.  · Putting duct tape over the top of the wart (occlusion). You will leave the tape in place for as long as told by your health care provider and then replace it with a new strip of tape. This is done until the wart goes away.  · Freezing the wart with liquid nitrogen (cryotherapy).  · Burning the wart with:  ? Laser treatment.  ? An electrified probe (electrocautery).  · Injection of a medicine (Candida antigen) into the wart to help the body's immune system fight off the wart.  · Surgery to remove the wart.  Follow these instructions at home:  Medicines  · Apply over-the-counter and prescription medicines only as told by your health care provider.  · Do not apply over-the-counter wart medicines to your face or genitals unless your health care provider tells you to do that.  Lifestyle  · Keep your immune system healthy. To do this:  ? Eat a healthy, balanced diet.  ? Get enough sleep.  ? Do not use any products that contain nicotine or tobacco, such as cigarettes and e-cigarettes. If you need help quitting, ask your health care provider.  General instructions    · Wash your hands   after you touch a wart.  · Do not scratch or pick at a wart.  · Avoid shaving hair that is over a wart.  · Keep all follow-up visits as told by your health care provider. This is important.  Contact a health care provider if:  · Your warts do not improve after treatment.  · You have redness, swelling, or pain at the site of a wart.  · You have bleeding from a wart that does not stop with light pressure.  · You have diabetes and you develop a wart.  Summary  · Warts are small growths on the skin. They are common and can occur on many areas of the body.  · In many cases, warts do not need treatment. Sometimes treatment is desired. If treatment is needed or desired, there are several treatment options.  · Apply over-the-counter and prescription medicines only as told by your health care provider.  · Wash your hands  after you touch a wart.  · Keep all follow-up visits as told by your health care provider. This is important.  This information is not intended to replace advice given to you by your health care provider. Make sure you discuss any questions you have with your health care provider.  Document Released: 06/08/2005 Document Revised: 01/16/2018 Document Reviewed: 01/16/2018  [employer] Interactive Patient Education © 2019 [employer]

## 2021-08-07 ENCOUNTER — Encounter (HOSPITAL_BASED_OUTPATIENT_CLINIC_OR_DEPARTMENT_OTHER): Payer: Self-pay | Admitting: Emergency Medicine

## 2021-08-07 ENCOUNTER — Other Ambulatory Visit: Payer: Self-pay

## 2021-08-07 ENCOUNTER — Emergency Department (HOSPITAL_BASED_OUTPATIENT_CLINIC_OR_DEPARTMENT_OTHER)
Admission: EM | Admit: 2021-08-07 | Discharge: 2021-08-07 | Disposition: A | Discharge status: Home / Self Care | Payer: 59 | Attending: Emergency Medicine | Admitting: Emergency Medicine

## 2021-08-07 DIAGNOSIS — L0231 Cutaneous abscess of buttock: Secondary | ICD-10-CM

## 2021-08-07 DIAGNOSIS — J45909 Unspecified asthma, uncomplicated: Secondary | ICD-10-CM | POA: Diagnosis not present

## 2021-08-07 DIAGNOSIS — F17210 Nicotine dependence, cigarettes, uncomplicated: Secondary | ICD-10-CM | POA: Insufficient documentation

## 2021-08-07 MED ORDER — DOXYCYCLINE HYCLATE 100 MG PO TABS
100.0000 mg | ORAL_TABLET | Freq: Once | ORAL | Status: AC
  Administered 2021-08-07: 100 mg via ORAL
  Filled 2021-08-07: qty 1

## 2021-08-07 MED ORDER — IBUPROFEN 400 MG PO TABS
600.0000 mg | ORAL_TABLET | Freq: Once | ORAL | Status: AC
  Administered 2021-08-07: 600 mg via ORAL
  Filled 2021-08-07: qty 1

## 2021-08-07 MED ORDER — DOXYCYCLINE HYCLATE 100 MG PO CAPS: 100.0000 mg | ORAL_CAPSULE | Freq: Two times a day (BID) | ORAL | 0 refills | Status: DC

## 2021-08-07 MED ORDER — HYDROCODONE-ACETAMINOPHEN 5-325 MG PO TABS: 1.0000 | ORAL_TABLET | Freq: Four times a day (QID) | ORAL | 0 refills | Status: DC | PRN

## 2021-08-07 MED ORDER — LIDOCAINE HCL (PF) 1 % IJ SOLN
5.0000 mL | Freq: Once | INTRAMUSCULAR | Status: AC
  Administered 2021-08-07: 5 mL via INTRADERMAL
  Filled 2021-08-07: qty 5

## 2021-08-07 NOTE — ED Provider Notes (Addendum)
[facility] EMERGENCY DEPARTMENT Provider Note   CSN: [id number] Arrival date & time: 08/07/21  0157     History Chief Complaint  Patient presents with   Abscess    Samuel Harrison is a 40 y.o. male.  Patient is a 40 year old male with no significant past medical history.  He presents today with complaints of swelling and pain to his buttock.  This has been worsening over the past several days.  He denies any fevers or chills.  He denies any difficulty with bowel movements.  The history is provided by the patient.  Abscess Abscess location: Buttock. Abscess quality: fluctuance, induration and painful   Duration:  3 days Progression:  Worsening Relieved by:  Nothing Worsened by:  Nothing Ineffective treatments:  Warm compresses and warm water soaks Associated symptoms: no fever       Past Medical History:  Diagnosis Date   Hernia     Patient Active Problem List   Diagnosis Date Noted   Vitamin D deficiency 02/03/2015   Bilateral inguinal hernia 11/03/2011   ANXIETY 06/21/2010   TOBACCO USE 06/21/2010   ALLERGIC RHINITIS 06/21/2010   ASTHMA 06/21/2010   INGUINAL HERNIA, RIGHT 06/21/2010    Past Surgical History:  Procedure Laterality Date   HERNIA REPAIR     KNEE SURGERY         History reviewed. No pertinent family history.  Social History   Tobacco Use   Smoking status: Every Day    Packs/day: 1.00    Types: Cigarettes   Smokeless tobacco: Never  Substance Use Topics   Alcohol use: No    Comment: rarely   Drug use: No    Home Medications Prior to Admission medications   Medication Sig Start Date End Date Taking? Authorizing Provider  albuterol (VENTOLIN HFA) 108 (90 Base) MCG/ACT inhaler Use 2 inhalations 5 minutes apart every 4 hours if needed to rescue wheezing 01/11/17 01/11/18  Doree Albee, PA-C  amphetamine-dextroamphetamine (ADDERALL) 20 MG tablet Take 20 mg by mouth daily.    [provider]  OVER THE COUNTER  MEDICATION OTC allergy tablet PRN    [provider]    Allergies    Patient has no known allergies.  Review of Systems   Review of Systems  Constitutional:  Negative for fever.  All other systems reviewed and are negative.  Physical Exam Updated Vital Signs BP (!) 142/96 (BP Location: Right Arm)   Pulse 98   Temp 99.1 F (37.3 C) (Oral)   Resp 20   Ht 5\' 5"  (1.651 m)   Wt 70.3 kg   SpO2 100%   BMI 25.79 kg/m   Physical Exam Vitals and nursing note reviewed.  Constitutional:      General: He is not in acute distress.    Appearance: Normal appearance. He is not ill-appearing.  HENT:     Head: Normocephalic and atraumatic.  Pulmonary:     Effort: Pulmonary effort is normal.  Genitourinary:    Comments: There is a swollen, fluctuant, tender area noted to the right buttock within the gluteal cleft, but not adjacent to the rectum. Skin:    General: Skin is warm and dry.  Neurological:     Mental Status: He is alert.    ED Results / Procedures / Treatments   Labs (all labs ordered are listed, but only abnormal results are displayed) Labs Reviewed - No data to display  EKG None  Radiology No results found.  Procedures Procedures  Medications Ordered in ED Medications  lidocaine (PF) (XYLOCAINE) 1 % injection 5 mL (has no administration in time range)    ED Course  I have reviewed the triage vital signs and the nursing notes.  Pertinent labs & imaging results that were available during my care of the patient were reviewed by me and considered in my medical decision making (see chart for details).    MDM Rules/Calculators/A&P  Patient presenting with an abscess to his right buttock within the gluteal cleft.  This was incised and drained as below.  Patient to be discharged with doxycycline, warm compresses/sitz bath's, and return as needed for any problems.  INCISION AND DRAINAGE Performed by: Geoffery Lyons Consent: Verbal consent  obtained. Risks and benefits: risks, benefits and alternatives were discussed Type: abscess  Body area: Right buttock  Anesthesia: local infiltration  Incision was made with a scalpel.  Local anesthetic: lidocaine 1% without epinephrine  Anesthetic total: 4 ml  Complexity: complex Blunt dissection to break up loculations  Drainage: purulent  Drainage amount: Moderate  Packing material: No packing placed  Patient tolerance: Patient tolerated the procedure well with no immediate complications.  Patient also inquires about a right-sided inguinal hernia that he has had for quite some time.  I will place a referral to [facility] surgery for him to follow-up.  Final Clinical Impression(s) / ED Diagnoses Final diagnoses:  None    Rx / DC Orders ED Discharge Orders     None        Geoffery Lyons, MD 08/07/21 1937    Geoffery Lyons, MD 08/07/21 [phone]

## 2021-08-07 NOTE — Discharge Instructions (Signed)
Begin taking doxycycline as prescribed.  Take ibuprofen 600 mg every 6 hours as needed for pain.  If this is not helping, a prescription for hydrocodone has been sent to your pharmacy.  Apply warm compresses or perform sits baths as frequently as possible for the next several days.  Return to the emergency department if symptoms significantly worsen or change.  A referral has been placed to [facility] surgery to have your hernia evaluated.  Please call the number provided to make these arrangements.

## 2021-08-07 NOTE — ED Triage Notes (Signed)
Abcess left upper buttock/lower back. X 3 days. Worse today.

## 2021-08-07 NOTE — ED Notes (Signed)
Discharge instructions discussed with pt. Pt verbalize understanding with no questions at this time. Prescription for ABX provider for pt.

## 2021-10-18 DIAGNOSIS — F332 Major depressive disorder, recurrent severe without psychotic features: Secondary | ICD-10-CM | POA: Diagnosis not present

## 2021-10-18 DIAGNOSIS — F902 Attention-deficit hyperactivity disorder, combined type: Secondary | ICD-10-CM | POA: Diagnosis not present

## 2021-10-18 DIAGNOSIS — R69 Illness, unspecified: Secondary | ICD-10-CM | POA: Diagnosis not present

## 2021-10-18 DIAGNOSIS — F4323 Adjustment disorder with mixed anxiety and depressed mood: Secondary | ICD-10-CM | POA: Diagnosis not present

## 2021-10-26 DIAGNOSIS — F331 Major depressive disorder, recurrent, moderate: Secondary | ICD-10-CM | POA: Diagnosis not present

## 2021-10-26 DIAGNOSIS — R69 Illness, unspecified: Secondary | ICD-10-CM | POA: Diagnosis not present

## 2021-10-26 DIAGNOSIS — F902 Attention-deficit hyperactivity disorder, combined type: Secondary | ICD-10-CM | POA: Diagnosis not present

## 2021-11-02 DIAGNOSIS — R69 Illness, unspecified: Secondary | ICD-10-CM | POA: Diagnosis not present

## 2021-11-02 DIAGNOSIS — F902 Attention-deficit hyperactivity disorder, combined type: Secondary | ICD-10-CM | POA: Diagnosis not present

## 2021-11-02 DIAGNOSIS — F331 Major depressive disorder, recurrent, moderate: Secondary | ICD-10-CM | POA: Diagnosis not present

## 2021-11-03 ENCOUNTER — Emergency Department (HOSPITAL_BASED_OUTPATIENT_CLINIC_OR_DEPARTMENT_OTHER)
Admission: EM | Admit: 2021-11-03 | Discharge: 2021-11-03 | Disposition: A | Discharge status: Home / Self Care | Payer: 59 | Attending: Emergency Medicine | Admitting: Emergency Medicine

## 2021-11-03 ENCOUNTER — Encounter (HOSPITAL_BASED_OUTPATIENT_CLINIC_OR_DEPARTMENT_OTHER): Payer: Self-pay | Admitting: *Deleted

## 2021-11-03 ENCOUNTER — Other Ambulatory Visit: Payer: Self-pay

## 2021-11-03 ENCOUNTER — Emergency Department (HOSPITAL_BASED_OUTPATIENT_CLINIC_OR_DEPARTMENT_OTHER): Payer: 59

## 2021-11-03 DIAGNOSIS — S8992XA Unspecified injury of left lower leg, initial encounter: Secondary | ICD-10-CM | POA: Diagnosis not present

## 2021-11-03 DIAGNOSIS — F17200 Nicotine dependence, unspecified, uncomplicated: Secondary | ICD-10-CM | POA: Diagnosis not present

## 2021-11-03 DIAGNOSIS — Y93H2 Activity, gardening and landscaping: Secondary | ICD-10-CM | POA: Diagnosis not present

## 2021-11-03 DIAGNOSIS — W293XXA Contact with powered garden and outdoor hand tools and machinery, initial encounter: Secondary | ICD-10-CM | POA: Diagnosis not present

## 2021-11-03 DIAGNOSIS — R69 Illness, unspecified: Secondary | ICD-10-CM | POA: Diagnosis not present

## 2021-11-03 DIAGNOSIS — S81012A Laceration without foreign body, left knee, initial encounter: Secondary | ICD-10-CM | POA: Insufficient documentation

## 2021-11-03 DIAGNOSIS — Y990 Civilian activity done for income or pay: Secondary | ICD-10-CM | POA: Diagnosis not present

## 2021-11-03 DIAGNOSIS — Z23 Encounter for immunization: Secondary | ICD-10-CM | POA: Diagnosis not present

## 2021-11-03 DIAGNOSIS — S81812A Laceration without foreign body, left lower leg, initial encounter: Secondary | ICD-10-CM | POA: Diagnosis not present

## 2021-11-03 MED ORDER — DOXYCYCLINE HYCLATE 100 MG PO CAPS: 100.0000 mg | ORAL_CAPSULE | Freq: Two times a day (BID) | ORAL | 0 refills | Status: DC

## 2021-11-03 MED ORDER — LIDOCAINE HCL (PF) 1 % IJ SOLN
5.0000 mL | Freq: Once | INTRAMUSCULAR | Status: AC
  Administered 2021-11-03: 5 mL via INTRADERMAL
  Filled 2021-11-03: qty 5

## 2021-11-03 MED ORDER — TETANUS-DIPHTH-ACELL PERTUSSIS 5-2.5-18.5 LF-MCG/0.5 IM SUSY
0.5000 mL | PREFILLED_SYRINGE | Freq: Once | INTRAMUSCULAR | Status: AC
  Administered 2021-11-03: 0.5 mL via INTRAMUSCULAR
  Filled 2021-11-03: qty 0.5

## 2021-11-03 MED ORDER — OXYCODONE-ACETAMINOPHEN 5-325 MG PO TABS
1.0000 | ORAL_TABLET | Freq: Once | ORAL | Status: AC
  Administered 2021-11-03: 1 via ORAL
  Filled 2021-11-03: qty 1

## 2021-11-03 MED ORDER — KETOROLAC TROMETHAMINE 60 MG/2ML IM SOLN
30.0000 mg | Freq: Once | INTRAMUSCULAR | Status: DC
  Filled 2021-11-03: qty 2

## 2021-11-03 NOTE — ED Triage Notes (Signed)
Pr reports cut left knee with chainsaw today around 11 am. Bandage not removed in triage. Pt has pictures of injury on his phone. States laceration has been bleeding when he removes the dressing

## 2021-11-03 NOTE — ED Provider Notes (Signed)
[facility] EMERGENCY DEPARTMENT Provider Note   CSN: [id number] Arrival date & time: 11/03/21  1813     History Chief Complaint  Patient presents with   Leg Injury    Samuel Harrison is a 41 y.o. male with history of anxiety and ADHD presents to the emergency department for evaluation of left knee pain and laceration.  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  He denies any other injury.  He did not fall out of the tree.  He denies any back or neck pain.  He reports that he immediately covered the wound with a dressing, but he thought that he could see blood coming out of it, so he presented to the emergency department for evaluation.  He reports that he does not know when his last tetanus was, but reports it was most likely over 10 years ago.  Patient is a 1 to 2 pack/day smoker.  Denies any EtOH or illicit drug use.  HPI     Home Medications Prior to Admission medications   Medication Sig Start Date End Date Taking? Authorizing Provider  albuterol (VENTOLIN HFA) 108 (90 Base) MCG/ACT inhaler Use 2 inhalations 5 minutes apart every 4 hours if needed to rescue wheezing 01/11/17 01/11/18  Doree Albee, PA-C  amphetamine-dextroamphetamine (ADDERALL) 20 MG tablet Take 20 mg by mouth daily.    [provider]  doxycycline (VIBRAMYCIN) 100 MG capsule Take 1 capsule (100 mg total) by mouth 2 (two) times daily. One po bid x 7 days 08/07/21   Geoffery Lyons, MD  HYDROcodone-acetaminophen (NORCO) 5-325 MG tablet Take 1-2 tablets by mouth every 6 (six) hours as needed. 08/07/21   Geoffery Lyons, MD  OVER THE COUNTER MEDICATION OTC allergy tablet PRN    [provider]      Allergies    Patient has no known allergies.    Review of Systems   Review of Systems  Skin:  Positive for wound.   Physical Exam Updated Vital Signs BP 121/79 (BP  Location: Left Arm)    Pulse 87    Temp 98.5 F (36.9 C) (Oral)    Resp 18    Ht 5\' 5"  (1.651 m)    Wt 70.3 kg    SpO2 99%    BMI 25.79 kg/m  Physical Exam Vitals and nursing note reviewed.  Constitutional:      General: He is not in acute distress.    Appearance: Normal appearance. He is not toxic-appearing.  Eyes:     General: No scleral icterus. Cardiovascular:     Rate and Rhythm: Normal rate and regular rhythm.  Pulmonary:     Effort: Pulmonary effort is normal. No respiratory distress.  Musculoskeletal:        General: Tenderness present.     Comments: Patient has a approximately 2.5 cm overlying the patella on the left knee.  Laceration is approximately 2 mm deep.  When I remove the bandage, there was no bleeding noted.  The patient has full range of motion of his knee although with some pain, but he reports ports mainly from the laceration.  He has palpable DP and PT pulses.  Compartments are soft.  Sensation intact bilaterally.  Skin:    General: Skin is dry.     Findings: No rash.  Neurological:     General: No focal deficit  present.     Mental Status: He is alert. Mental status is at baseline.  Psychiatric:        Mood and Affect: Mood normal.    ED Results / Procedures / Treatments   Labs (all labs ordered are listed, but only abnormal results are displayed) Labs Reviewed - No data to display  EKG None  Radiology DG Knee Complete 4 Views Left  Result Date: 11/03/2021 CLINICAL DATA:  Leg injury, laceration. EXAM: LEFT KNEE - COMPLETE 4+ VIEW COMPARISON:  None. FINDINGS: No evidence of fracture, dislocation, or joint effusion. No evidence of arthropathy or other focal bone abnormality. Skin defect anteriorly above the patella likely site of laceration, mild associated skin thickening. There is no tracking soft tissue gas or radiopaque foreign body. IMPRESSION: Skin defect anteriorly above the patella likely site of laceration. No radiopaque foreign body or acute  osseous abnormality. Electronically Signed   By: Narda Rutherford M.D.   On: 11/03/2021 19:49    Procedures .Marland KitchenLaceration Repair  Date/Time: 11/06/2021 9:14 PM Performed by: Achille Rich, PA-C Authorized by: Achille Rich, PA-C   Consent:    Consent obtained:  Verbal   Consent given by:  Patient   Risks discussed:  Infection, need for additional repair, pain, poor cosmetic result and poor wound healing   Alternatives discussed:  No treatment and delayed treatment Universal protocol:    Procedure explained and questions answered to patient or proxy's satisfaction: yes     Relevant documents present and verified: no     Test results available: no     Imaging studies available: yes     Required blood products, implants, devices, and special equipment available: no     Site/side marked: no     Immediately prior to procedure, a time out was called: no     Patient identity confirmed:  Verbally with patient Anesthesia:    Anesthesia method:  Local infiltration   Local anesthetic:  Lidocaine 1% w/o epi Laceration details:    Location:  Leg   Leg location:  L knee   Length (cm):  2.5   Depth (mm):  2 Pre-procedure details:    Preparation:  Patient was prepped and draped in usual sterile fashion and imaging obtained to evaluate for foreign bodies Exploration:    Imaging outcome: foreign body not noted     Wound exploration: wound explored through full range of motion and entire depth of wound visualized   Treatment:    Area cleansed with:  Shur-Clens and saline   Amount of cleaning:  Extensive Skin repair:    Repair method:  Sutures   Suture size:  4-0   Suture material:  Prolene   Suture technique:  Simple interrupted   Number of sutures:  2 Approximation:    Approximation:  Close Repair type:    Repair type:  Simple Post-procedure details:    Dressing:  Non-adherent dressing   Procedure completion:  Tolerated well, no immediate complications   Medications Ordered in  ED Medications  ketorolac (TORADOL) injection 30 mg (has no administration in time range)  lidocaine (PF) (XYLOCAINE) 1 % injection 5 mL (has no administration in time range)  oxyCODONE-acetaminophen (PERCOCET/ROXICET) 5-325 MG per tablet 1 tablet (has no administration in time range)  Tdap (BOOSTRIX) injection 0.5 mL (0.5 mLs Intramuscular Given 11/03/21 1950)    ED Course/ Medical Decision Making/ A&P  Medical Decision Making Amount and/or Complexity of Data Reviewed Radiology: ordered.  Risk Prescription drug management.   41 year old male presents emerged from for evaluation of left knee laceration sustained at work today from a chainsaw.  Will image needed make sure there is no return foreign body or any underlying fracture.  I independently reviewed and interpreted the patient's imaging.  I agree with radiologist interpretation.  There is a skin defect anteriorly over the patella likely site of laceration but no radiopaque foreign body or acute osseous abnormality visualized.  The patient was given a tetanus booster given the mechanism of his injury.  He was also given Percocet and Toradol for his pain.  2 simple interrupted sutures were placed in the knee and a nonadherent dressing was placed.  Patient tolerated procedure well.  Recommended the patient return to the ER or urgent care or PCP in the next 7 to 10 days for suture removal.  Given the contamination of the knee with a chainsaw and the dirty bandage of his end, will place patient on doxycycline to aid in prevention of any contamination.  Laceration care discussed with the patient and wife at bedside.  Discussed the use of doxycycline for the contamination of the wound.  Strict return precautions discussed.  Patient is a plan.  Patient is stable being discharged home in good condition.   Final Clinical Impression(s) / ED Diagnoses Final diagnoses:  Knee laceration, left, initial encounter     Rx / DC Orders ED Discharge Orders          Ordered    doxycycline (VIBRAMYCIN) 100 MG capsule  2 times daily        11/03/21 2235              Achille Rich, Cordelia Poche 11/06/21 2120    Milagros Loll, MD 11/07/21 [phone]

## 2021-11-03 NOTE — Discharge Instructions (Addendum)
You were seen here for evaluation of your left knee injury. Your imaging did not show any sign of fracture or retained foreign body. We placed two sutures on your knee that you will need to keep covered for the next few days. Please see your PCP, urgent care, or the ER for suture removal in 7 days. I have prescribed you doxycycline to take every day, twice a day, for the next week to prevent infection. Please do not extensively bend your knee or put direct pressure on it. You can take Tylenol and/or ibuprofen as needed for pain. If you have any fevers, abnormal discharge, worsening pain or swelling, please return to the nearest ER for re-evaluation.

## 2021-11-09 DIAGNOSIS — F331 Major depressive disorder, recurrent, moderate: Secondary | ICD-10-CM | POA: Diagnosis not present

## 2021-11-09 DIAGNOSIS — R69 Illness, unspecified: Secondary | ICD-10-CM | POA: Diagnosis not present

## 2021-11-09 DIAGNOSIS — F902 Attention-deficit hyperactivity disorder, combined type: Secondary | ICD-10-CM | POA: Diagnosis not present

## 2021-11-16 DIAGNOSIS — F331 Major depressive disorder, recurrent, moderate: Secondary | ICD-10-CM | POA: Diagnosis not present

## 2021-11-16 DIAGNOSIS — R69 Illness, unspecified: Secondary | ICD-10-CM | POA: Diagnosis not present

## 2021-11-16 DIAGNOSIS — F902 Attention-deficit hyperactivity disorder, combined type: Secondary | ICD-10-CM | POA: Diagnosis not present

## 2021-11-18 DIAGNOSIS — R69 Illness, unspecified: Secondary | ICD-10-CM | POA: Diagnosis not present

## 2021-11-18 DIAGNOSIS — F331 Major depressive disorder, recurrent, moderate: Secondary | ICD-10-CM | POA: Diagnosis not present

## 2021-11-18 DIAGNOSIS — F902 Attention-deficit hyperactivity disorder, combined type: Secondary | ICD-10-CM | POA: Diagnosis not present

## 2021-11-23 DIAGNOSIS — R69 Illness, unspecified: Secondary | ICD-10-CM | POA: Diagnosis not present

## 2021-11-23 DIAGNOSIS — F902 Attention-deficit hyperactivity disorder, combined type: Secondary | ICD-10-CM | POA: Diagnosis not present

## 2021-11-23 DIAGNOSIS — F331 Major depressive disorder, recurrent, moderate: Secondary | ICD-10-CM | POA: Diagnosis not present

## 2021-11-30 DIAGNOSIS — F902 Attention-deficit hyperactivity disorder, combined type: Secondary | ICD-10-CM | POA: Diagnosis not present

## 2021-11-30 DIAGNOSIS — F331 Major depressive disorder, recurrent, moderate: Secondary | ICD-10-CM | POA: Diagnosis not present

## 2021-11-30 DIAGNOSIS — R69 Illness, unspecified: Secondary | ICD-10-CM | POA: Diagnosis not present

## 2021-12-07 DIAGNOSIS — F902 Attention-deficit hyperactivity disorder, combined type: Secondary | ICD-10-CM | POA: Diagnosis not present

## 2021-12-07 DIAGNOSIS — R69 Illness, unspecified: Secondary | ICD-10-CM | POA: Diagnosis not present

## 2021-12-07 DIAGNOSIS — F331 Major depressive disorder, recurrent, moderate: Secondary | ICD-10-CM | POA: Diagnosis not present

## 2021-12-14 DIAGNOSIS — F331 Major depressive disorder, recurrent, moderate: Secondary | ICD-10-CM | POA: Diagnosis not present

## 2021-12-14 DIAGNOSIS — F902 Attention-deficit hyperactivity disorder, combined type: Secondary | ICD-10-CM | POA: Diagnosis not present

## 2021-12-14 DIAGNOSIS — R69 Illness, unspecified: Secondary | ICD-10-CM | POA: Diagnosis not present

## 2021-12-16 DIAGNOSIS — R69 Illness, unspecified: Secondary | ICD-10-CM | POA: Diagnosis not present

## 2021-12-16 DIAGNOSIS — F902 Attention-deficit hyperactivity disorder, combined type: Secondary | ICD-10-CM | POA: Diagnosis not present

## 2021-12-16 DIAGNOSIS — F331 Major depressive disorder, recurrent, moderate: Secondary | ICD-10-CM | POA: Diagnosis not present

## 2021-12-21 DIAGNOSIS — F902 Attention-deficit hyperactivity disorder, combined type: Secondary | ICD-10-CM | POA: Diagnosis not present

## 2021-12-21 DIAGNOSIS — R69 Illness, unspecified: Secondary | ICD-10-CM | POA: Diagnosis not present

## 2021-12-21 DIAGNOSIS — F331 Major depressive disorder, recurrent, moderate: Secondary | ICD-10-CM | POA: Diagnosis not present

## 2022-01-11 DIAGNOSIS — F331 Major depressive disorder, recurrent, moderate: Secondary | ICD-10-CM | POA: Diagnosis not present

## 2022-01-11 DIAGNOSIS — R69 Illness, unspecified: Secondary | ICD-10-CM | POA: Diagnosis not present

## 2022-01-11 DIAGNOSIS — F902 Attention-deficit hyperactivity disorder, combined type: Secondary | ICD-10-CM | POA: Diagnosis not present

## 2022-01-17 DIAGNOSIS — F332 Major depressive disorder, recurrent severe without psychotic features: Secondary | ICD-10-CM | POA: Diagnosis not present

## 2022-01-17 DIAGNOSIS — R69 Illness, unspecified: Secondary | ICD-10-CM | POA: Diagnosis not present

## 2022-01-17 DIAGNOSIS — F902 Attention-deficit hyperactivity disorder, combined type: Secondary | ICD-10-CM | POA: Diagnosis not present

## 2022-01-18 DIAGNOSIS — F902 Attention-deficit hyperactivity disorder, combined type: Secondary | ICD-10-CM | POA: Diagnosis not present

## 2022-01-18 DIAGNOSIS — F331 Major depressive disorder, recurrent, moderate: Secondary | ICD-10-CM | POA: Diagnosis not present

## 2022-01-18 DIAGNOSIS — R69 Illness, unspecified: Secondary | ICD-10-CM | POA: Diagnosis not present

## 2022-01-25 DIAGNOSIS — F902 Attention-deficit hyperactivity disorder, combined type: Secondary | ICD-10-CM | POA: Diagnosis not present

## 2022-01-25 DIAGNOSIS — F331 Major depressive disorder, recurrent, moderate: Secondary | ICD-10-CM | POA: Diagnosis not present

## 2022-01-25 DIAGNOSIS — R69 Illness, unspecified: Secondary | ICD-10-CM | POA: Diagnosis not present

## 2022-02-01 DIAGNOSIS — R69 Illness, unspecified: Secondary | ICD-10-CM | POA: Diagnosis not present

## 2022-02-01 DIAGNOSIS — F331 Major depressive disorder, recurrent, moderate: Secondary | ICD-10-CM | POA: Diagnosis not present

## 2022-02-01 DIAGNOSIS — F902 Attention-deficit hyperactivity disorder, combined type: Secondary | ICD-10-CM | POA: Diagnosis not present

## 2022-02-08 DIAGNOSIS — F902 Attention-deficit hyperactivity disorder, combined type: Secondary | ICD-10-CM | POA: Diagnosis not present

## 2022-02-08 DIAGNOSIS — F331 Major depressive disorder, recurrent, moderate: Secondary | ICD-10-CM | POA: Diagnosis not present

## 2022-02-08 DIAGNOSIS — R69 Illness, unspecified: Secondary | ICD-10-CM | POA: Diagnosis not present

## 2022-02-21 ENCOUNTER — Emergency Department (HOSPITAL_BASED_OUTPATIENT_CLINIC_OR_DEPARTMENT_OTHER)
Admission: EM | Admit: 2022-02-21 | Discharge: 2022-02-22 | Disposition: A | Discharge status: Home / Self Care | Payer: 59 | Attending: Emergency Medicine | Admitting: Emergency Medicine

## 2022-02-21 ENCOUNTER — Other Ambulatory Visit: Payer: Self-pay

## 2022-02-21 ENCOUNTER — Encounter (HOSPITAL_BASED_OUTPATIENT_CLINIC_OR_DEPARTMENT_OTHER): Payer: Self-pay | Admitting: Emergency Medicine

## 2022-02-21 ENCOUNTER — Emergency Department (HOSPITAL_BASED_OUTPATIENT_CLINIC_OR_DEPARTMENT_OTHER): Payer: 59

## 2022-02-21 DIAGNOSIS — R0789 Other chest pain: Secondary | ICD-10-CM | POA: Diagnosis not present

## 2022-02-21 DIAGNOSIS — R079 Chest pain, unspecified: Secondary | ICD-10-CM | POA: Diagnosis not present

## 2022-02-21 DIAGNOSIS — R0602 Shortness of breath: Secondary | ICD-10-CM | POA: Diagnosis not present

## 2022-02-21 HISTORY — DX: Other specified behavioral and emotional disorders with onset usually occurring in childhood and adolescence: F98.8

## 2022-02-21 HISTORY — DX: Unspecified asthma, uncomplicated: J45.909

## 2022-02-21 LAB — CBC WITH DIFFERENTIAL/PLATELET
Abs Immature Granulocytes: 0.06 10*3/uL (ref 0.00–0.07)
Basophils Absolute: 0.1 10*3/uL (ref 0.0–0.1)
Basophils Relative: 1 %
Eosinophils Absolute: 0.2 10*3/uL (ref 0.0–0.5)
Eosinophils Relative: 3 %
HCT: 45.3 % (ref 39.0–52.0)
Hemoglobin: 15.1 g/dL (ref 13.0–17.0)
Immature Granulocytes: 1 %
Lymphocytes Relative: 41 %
Lymphs Abs: 3.3 10*3/uL (ref 0.7–4.0)
MCH: 30.7 pg (ref 26.0–34.0)
MCHC: 33.3 g/dL (ref 30.0–36.0)
MCV: 92.1 fL (ref 80.0–100.0)
Monocytes Absolute: 0.5 10*3/uL (ref 0.1–1.0)
Monocytes Relative: 6 %
Neutro Abs: 3.9 10*3/uL (ref 1.7–7.7)
Neutrophils Relative %: 48 %
Platelets: 293 10*3/uL (ref 150–400)
RBC: 4.92 MIL/uL (ref 4.22–5.81)
RDW: 12.9 % (ref 11.5–15.5)
WBC: 8.1 10*3/uL (ref 4.0–10.5)
nRBC: 0 % (ref 0.0–0.2)

## 2022-02-21 LAB — COMPREHENSIVE METABOLIC PANEL
ALT: 29 U/L (ref 0–44)
AST: 22 U/L (ref 15–41)
Albumin: 4.4 g/dL (ref 3.5–5.0)
Alkaline Phosphatase: 91 U/L (ref 38–126)
Anion gap: 6 (ref 5–15)
BUN: 19 mg/dL (ref 6–20)
CO2: 27 mmol/L (ref 22–32)
Calcium: 9.1 mg/dL (ref 8.9–10.3)
Chloride: 107 mmol/L (ref 98–111)
Creatinine, Ser: 1.02 mg/dL (ref 0.61–1.24)
GFR, Estimated: >60 mL/min (ref >60)
Glucose, Bld: 76 mg/dL (ref 70–99)
Potassium: 3.9 mmol/L (ref 3.5–5.1)
Sodium: 140 mmol/L (ref 135–145)
Total Bilirubin: 0.7 mg/dL (ref 0.3–1.2)
Total Protein: 7.7 g/dL (ref 6.5–8.1)

## 2022-02-21 LAB — TROPONIN I (HIGH SENSITIVITY): Troponin I (High Sensitivity): <2 ng/L (ref <18)

## 2022-02-21 NOTE — ED Notes (Signed)
Pt. Here with c/o chest pain and reports it started after his wifes dog ran into the front yard.  Pt. Is in no distress and has no noted shob.  Pt. Is alert and oriented with no distress noted.

## 2022-02-21 NOTE — ED Provider Notes (Signed)
[facility] EMERGENCY DEPARTMENT Provider Note   CSN: [id number] Arrival date & time: 02/21/22  2147     History PMH: tobacco use Chief Complaint  Patient presents with   Chest Pain    Samuel Harrison is a 41 y.o. male. Patient presents the emergency department with a chief complaint of left-sided chest pain.  At 9 PM today he had sudden onset excruciating left-sided chest pain while he was at rest that felt like someone is squeezing him.  He said this lasted about 10 minutes or so and alleviated.  It did not radiate.  He did not have any other associated symptoms.  He has had similar episodes of pain multiple times in the past except it is typically worse than this current episode.  He was seen by the emergency department 2016 for chest pain where he ultimately left AMA after an incomplete work-up.  He has never been evaluated by cardiologist. He denies any shortness of breath, nausea, vomiting, abdominal pain, headache, visual changes, numbness, weakness, syncope.   Chest Pain      Home Medications Prior to Admission medications   Medication Sig Start Date End Date Taking? Authorizing Provider  albuterol (VENTOLIN HFA) 108 (90 Base) MCG/ACT inhaler Use 2 inhalations 5 minutes apart every 4 hours if needed to rescue wheezing 01/11/17 01/11/18  Doree Albee, PA-C  amphetamine-dextroamphetamine (ADDERALL) 20 MG tablet Take 20 mg by mouth daily.    [provider]      Allergies    Patient has no known allergies.    Review of Systems   Review of Systems  Cardiovascular:  Positive for chest pain.  All other systems reviewed and are negative.   Physical Exam Updated Vital Signs BP 119/84   Pulse 81   Temp 98.6 F (37 C) (Oral)   Resp 12   Ht 5\' 6"  (1.676 m)   Wt 70.3 kg   SpO2 100%   BMI 25.02 kg/m  Physical Exam Vitals and nursing note reviewed.  Constitutional:      General: He is not in acute distress.    Appearance: Normal appearance.  He is not ill-appearing, toxic-appearing or diaphoretic.  HENT:     Head: Normocephalic and atraumatic.     Nose: No nasal deformity.     Mouth/Throat:     Lips: Pink. No lesions.     Mouth: Mucous membranes are moist. No injury, lacerations, oral lesions or angioedema.     Pharynx: Oropharynx is clear. Uvula midline. No pharyngeal swelling, oropharyngeal exudate, posterior oropharyngeal erythema or uvula swelling.  Eyes:     General: Gaze aligned appropriately. No scleral icterus.       Right eye: No discharge.        Left eye: No discharge.     Conjunctiva/sclera: Conjunctivae normal.     Right eye: Right conjunctiva is not injected. No exudate or hemorrhage.    Left eye: Left conjunctiva is not injected. No exudate or hemorrhage.    Pupils: Pupils are equal, round, and reactive to light.  Cardiovascular:     Rate and Rhythm: Normal rate and regular rhythm.     Pulses: Normal pulses.          Radial pulses are 2+ on the right side and 2+ on the left side.       Dorsalis pedis pulses are 2+ on the right side and 2+ on the left side.     Heart sounds: Normal heart sounds, S1 normal  and S2 normal. Heart sounds not distant. No murmur heard.    No friction rub. No gallop. No S3 or S4 sounds.  Pulmonary:     Effort: Pulmonary effort is normal. No accessory muscle usage or respiratory distress.     Breath sounds: Normal breath sounds. No stridor. No wheezing, rhonchi or rales.     Comments: Mild reproducible chest wall tenderness to the left anterior chest Chest:     Chest wall: No tenderness.  Abdominal:     General: Abdomen is flat. There is no distension.     Palpations: Abdomen is soft. There is no mass or pulsatile mass.     Tenderness: There is no abdominal tenderness. There is no guarding or rebound.  Musculoskeletal:     Right lower leg: No edema.     Left lower leg: No edema.  Skin:    General: Skin is warm and dry.     Coloration: Skin is not jaundiced or pale.      Findings: No bruising, erythema, lesion or rash.  Neurological:     General: No focal deficit present.     Mental Status: He is alert and oriented to person, place, and time.     GCS: GCS eye subscore is 4. GCS verbal subscore is 5. GCS motor subscore is 6.     Comments: 5 out of 5 motor strength in all 4 extremities.  Normal sensation all 4 extremities.  Psychiatric:        Mood and Affect: Mood normal.        Behavior: Behavior normal. Behavior is cooperative.     ED Results / Procedures / Treatments   Labs (all labs ordered are listed, but only abnormal results are displayed) Labs Reviewed  CBC WITH DIFFERENTIAL/PLATELET  COMPREHENSIVE METABOLIC PANEL  TROPONIN I (HIGH SENSITIVITY)  TROPONIN I (HIGH SENSITIVITY)    EKG EKG Interpretation  Date/Time:  Monday February 21 2022 21:53:40 EDT Ventricular Rate:  81 PR Interval:  104 QRS Duration: 85 QT Interval:  373 QTC Calculation: 433 R Axis:   84 Text Interpretation: Sinus rhythm Short PR interval No significant change since last tracing Confirmed by Melene Plan [phone]) on 02/21/2022 10:00:09 PM  Radiology DG Chest Portable 1 View  Result Date: 02/21/2022 CLINICAL DATA:  Shortness of breath. EXAM: PORTABLE CHEST 1 VIEW COMPARISON:  Chest radiograph dated 08/31/2015. FINDINGS: The heart size and mediastinal contours are within normal limits. Both lungs are clear. The visualized skeletal structures are unremarkable. IMPRESSION: No active disease. Electronically Signed   By: Elgie Collard M.D.   On: 02/21/2022 22:03    Procedures Procedures  This patient was on telemetry or cardiac monitoring during their time in the ED.    Medications Ordered in ED Medications - No data to display  ED Course/ Medical Decision Making/ A&P                           Medical Decision Making Amount and/or Complexity of Data Reviewed Labs: ordered. Radiology: ordered.    MDM  This is a 41 y.o. male who presents to the ED with chest  pain The differential of this patient includes but is not limited to ACS/NSTEMI, Aortic Dissection, PE, PTX, GERD, MSK, Pericarditis, PNA  My Impression, Plan, and ED Course:  Presents with sudden onset chest pain at rest that started at 9 PM today.  Lasted about 10 minutes and has since alleviated without intervention.  He  is asymptomatic now.  He is a well-appearing male with stable vital signs.  He has had the same symptoms multiple times in the past and has never had a formal work-up for them. He does not have a PCP.  Doubt PE as he is PERC negative and low Wells Do not think this is an aortic dissection as history is not consistent Pain does not really seem consistent with ACS, but he has never had a cardiology workup before.   I personally ordered, reviewed, and interpreted all laboratory work and imaging and agree with radiologist interpretation. Results interpreted below:  CBC, CMP normal. Troponin < 2. Repeat pending.  So far, he has a reassuring workup. He will need repeat troponin level since symptom onset was < 3 hours from initial value.  HEART score 1.   Repeat troponin <2.  Symptoms have not reoccurred here. I do not think this is ACS. Will refer to cards due to frequent chest pain. He is stable and does not require any further workup. Return precautions provided.   Charting Requirements Additional history is obtained from:  Independent historian External Records from outside source obtained and reviewed including: Reviewed prior troponin levels and EKG Social Determinants of Health:  none Pertinant PMH that complicates patient's illness: n/a  Patient Care Problems that were addressed during this visit: - Atypical Chest Pain: Acute illness with systemic symptoms This patient was maintained on a cardiac monitor/telemetry. I personally viewed and interpreted the cardiac monitor which reveals an underlying rhythm of NSR Medications given in ED: n/a Reevaluation of the  patient after these medicines showed that the patient resolved I have reviewed home medications and made changes accordingly.  Critical Care Interventions: n/a Consultations: n/a Disposition: anticipate discharge. See following provider note  Portions of this note were generated with Dragon dictation software. Dictation errors may occur despite best attempts at proofreading.     Final Clinical Impression(s) / ED Diagnoses Final diagnoses:  Atypical chest pain    Rx / DC Orders ED Discharge Orders          Ordered    Ambulatory referral to Cardiology        02/21/22 2325              Claudie LeachLoeffler, Cason Dabney C, PA-C 02/22/22 0013    Melene PlanFloyd, Dan, DO 02/22/22 2256

## 2022-02-21 NOTE — ED Triage Notes (Signed)
Pt with chest pain x 1 hour. Denies pain at this time. Pt reports shob which has been going on for a while

## 2022-02-21 NOTE — Discharge Instructions (Addendum)
Your workup today was very reassuring. You did not have any EKG changes and had negative cardiac enzyme markers. Please return to the ED if you develop return of symptoms, worsening shortness of breath, or other new concerning symptoms.

## 2022-02-22 DIAGNOSIS — R69 Illness, unspecified: Secondary | ICD-10-CM | POA: Diagnosis not present

## 2022-02-22 DIAGNOSIS — F902 Attention-deficit hyperactivity disorder, combined type: Secondary | ICD-10-CM | POA: Diagnosis not present

## 2022-02-22 DIAGNOSIS — F331 Major depressive disorder, recurrent, moderate: Secondary | ICD-10-CM | POA: Diagnosis not present

## 2022-02-22 LAB — TROPONIN I (HIGH SENSITIVITY): Troponin I (High Sensitivity): 2 ng/L (ref <18)

## 2022-02-24 NOTE — Progress Notes (Deleted)
Cardiology Office Note:    Date:  02/24/2022   ID:  Samuel Harrison, DOB [birth year], MRN [id number]  PCP:  Pcp, No   [facility] Providers Cardiologist:  None { Click to update primary MD,subspecialty MD or APP then REFRESH:1}    Referring MD: Claudie Leach, PA-C   No chief complaint on file. ***  History of Present Illness:    Samuel Harrison is a 41 y.o. male with a hx of asthma, ED referral for left sided chest pain that was sudden and excruciating. It lasted 10 min. Troponin was negative. He is low risk for CVD. EKG had no ischemic changes. Family hx ***.  His symptoms have ***  Past Medical History:  Diagnosis Date   ADD (attention deficit disorder)    Asthma    Hernia     Past Surgical History:  Procedure Laterality Date   HERNIA REPAIR     KNEE SURGERY      Current Medications: No outpatient medications have been marked as taking for the 02/25/22 encounter (Appointment) with Maisie Fus, MD.     Allergies:   Patient has no known allergies.   Social History   Socioeconomic History   Marital status: Married    Spouse name: Not on file   Number of children: Not on file   Years of education: Not on file   Highest education level: Not on file  Occupational History   Not on file  Tobacco Use   Smoking status: Every Day    Packs/day: 1.00    Types: Cigarettes   Smokeless tobacco: Never  Substance and Sexual Activity   Alcohol use: No    Comment: rarely   Drug use: No   Sexual activity: Not on file  Other Topics Concern   Not on file  Social History Narrative   Not on file   Social Determinants of Health   Financial Resource Strain: Not on file  Food Insecurity: Not on file  Transportation Needs: Not on file  Physical Activity: Not on file  Stress: Not on file  Social Connections: Not on file     Family History: The patient's ***family history is not on file.  ROS:   Please see the history of present illness.    *** All  other systems reviewed and are negative.  EKGs/Labs/Other Studies Reviewed:    The following studies were reviewed today: ***  EKG:  EKG is *** ordered today.  The ekg ordered today demonstrates ***  Recent Labs: 02/21/2022: ALT 29; BUN 19; Creatinine, Ser 1.02; Hemoglobin 15.1; Platelets 293; Potassium 3.9; Sodium 140  Recent Lipid Panel No results found for: "CHOL", "TRIG", "HDL", "CHOLHDL", "VLDL", "LDLCALC", "LDLDIRECT"   Risk Assessment/Calculations:   {Does this patient have ATRIAL FIBRILLATION?[phone]}       Physical Exam:    VS:  There were no vitals taken for this visit.    Wt Readings from Last 3 Encounters:  02/21/22 155 lb (70.3 kg)  11/03/21 155 lb (70.3 kg)  08/07/21 155 lb (70.3 kg)     GEN: *** Well nourished, well developed in no acute distress HEENT: Normal NECK: No JVD; No carotid bruits LYMPHATICS: No lymphadenopathy CARDIAC: ***RRR, no murmurs, rubs, gallops RESPIRATORY:  Clear to auscultation without rales, wheezing or rhonchi  ABDOMEN: Soft, non-tender, non-distended MUSCULOSKELETAL:  No edema; No deformity  SKIN: Warm and dry NEUROLOGIC:  Alert and oriented x 3 PSYCHIATRIC:  Normal affect   ASSESSMENT:    Atypical CP:  The nature of the pain is very atypical. His  EKG today was normal. Her ASCVD risk is low. Consider the low pretest probability and atypical pain will not recommend further testing.   PLAN:    In order of problems listed above:  ***      {Are you ordering a CV Procedure (e.g. stress test, cath, DCCV, TEE, etc)?   Press F2        :[id number]}    Medication Adjustments/Labs and Tests Ordered: Current medicines are reviewed at length with the patient today.  Concerns regarding medicines are outlined above.  No orders of the defined types were placed in this encounter.  No orders of the defined types were placed in this encounter.   There are no Patient Instructions on file for this visit.   Signed, Maisie Fus, MD  02/24/2022 1:22 PM    [facility]

## 2022-02-25 ENCOUNTER — Ambulatory Visit: Payer: 59 | Admitting: Internal Medicine

## 2022-03-01 DIAGNOSIS — F331 Major depressive disorder, recurrent, moderate: Secondary | ICD-10-CM | POA: Diagnosis not present

## 2022-03-01 DIAGNOSIS — R69 Illness, unspecified: Secondary | ICD-10-CM | POA: Diagnosis not present

## 2022-03-01 DIAGNOSIS — F902 Attention-deficit hyperactivity disorder, combined type: Secondary | ICD-10-CM | POA: Diagnosis not present

## 2022-03-03 ENCOUNTER — Ambulatory Visit: Payer: 59 | Admitting: Internal Medicine

## 2022-03-03 NOTE — Progress Notes (Deleted)
Cardiology Office Note:    Date:  03/03/2022   ID:  Samuel Harrison, DOB 02-10-1981, MRN [id number]  PCP:  Pcp, No   [facility] Providers Cardiologist:  None { Click to update primary MD,subspecialty MD or APP then REFRESH:1}    Referring MD: Claudie Leach, PA-C   No chief complaint on file. Atypical CP  History of Present Illness:    Samuel Harrison is a 41 y.o. male with a hx of ADD, asthma, seen in the ED for L sided Cp that was sudden and excruciating. Lasted for 10 min. Concern was low for ACS and pericarditis. EKG was normal. He has no PCP and referral was sent to cardiology since he has not seen one.  Past Medical History:  Diagnosis Date   ADD (attention deficit disorder)    Asthma    Hernia     Past Surgical History:  Procedure Laterality Date   HERNIA REPAIR     KNEE SURGERY      Current Medications: No outpatient medications have been marked as taking for the 03/03/22 encounter (Appointment) with Maisie Fus, MD.     Allergies:   Patient has no known allergies.   Social History   Socioeconomic History   Marital status: Married    Spouse name: Not on file   Number of children: Not on file   Years of education: Not on file   Highest education level: Not on file  Occupational History   Not on file  Tobacco Use   Smoking status: Every Day    Packs/day: 1.00    Types: Cigarettes   Smokeless tobacco: Never  Substance and Sexual Activity   Alcohol use: No    Comment: rarely   Drug use: No   Sexual activity: Not on file  Other Topics Concern   Not on file  Social History Narrative   Not on file   Social Determinants of Health   Financial Resource Strain: Not on file  Food Insecurity: Not on file  Transportation Needs: Not on file  Physical Activity: Not on file  Stress: Not on file  Social Connections: Not on file     Family History: ***  ROS:   Please see the history of present illness.    *** All other systems  reviewed and are negative.  EKGs/Labs/Other Studies Reviewed:    The following studies were reviewed today: ***  EKG:  EKG is *** ordered today.  The ekg ordered today demonstrates ***  Recent Labs: 02/21/2022: ALT 29; BUN 19; Creatinine, Ser 1.02; Hemoglobin 15.1; Platelets 293; Potassium 3.9; Sodium 140  Recent Lipid Panel No results found for: "CHOL", "TRIG", "HDL", "CHOLHDL", "VLDL", "LDLCALC", "LDLDIRECT"   Risk Assessment/Calculations:   {Does this patient have ATRIAL FIBRILLATION?[phone]}       Physical Exam:    VS:  There were no vitals taken for this visit.    Wt Readings from Last 3 Encounters:  02/21/22 155 lb (70.3 kg)  11/03/21 155 lb (70.3 kg)  08/07/21 155 lb (70.3 kg)     GEN: *** Well nourished, well developed in no acute distress HEENT: Normal NECK: No JVD; No carotid bruits LYMPHATICS: No lymphadenopathy CARDIAC: ***RRR, no murmurs, rubs, gallops RESPIRATORY:  Clear to auscultation without rales, wheezing or rhonchi  ABDOMEN: Soft, non-tender, non-distended MUSCULOSKELETAL:  No edema; No deformity  SKIN: Warm and dry NEUROLOGIC:  Alert and oriented x 3 PSYCHIATRIC:  Normal affect   ASSESSMENT:    #Atypical CP:  this is non cardiac chest pain. His work up with unremarkable. Recommend follow with PCP for anxiety/depression screening.  PLAN:    In order of problems listed above:  ***      {Are you ordering a CV Procedure (e.g. stress test, cath, DCCV, TEE, etc)?   Press F2        :[id number]}    Medication Adjustments/Labs and Tests Ordered: Current medicines are reviewed at length with the patient today.  Concerns regarding medicines are outlined above.  No orders of the defined types were placed in this encounter.  No orders of the defined types were placed in this encounter.   There are no Patient Instructions on file for this visit.   Signed, Maisie Fus, MD  03/03/2022 11:07 AM    [facility]

## 2022-03-22 ENCOUNTER — Ambulatory Visit: Payer: 59 | Admitting: Internal Medicine

## 2022-03-22 NOTE — Progress Notes (Deleted)
Cardiology Office Note:    Date:  03/22/2022   ID:  Samuel Harrison, DOB 1981/06/18, MRN [id number]  PCP:  Pcp, No   [facility] Providers Cardiologist:  None { Click to update primary MD,subspecialty MD or APP then REFRESH:1}    Referring MD: Claudie Leach, PA-C   No chief complaint on file. Atypical CP  History of Present Illness:    Samuel Harrison is a 41 y.o. male with a hx of asthma referral for atypical CP. Went to the ED in June with excruciating CP that felt like a squeeze. It lasted for 10 minutes. It was mildly reproducible. ACS was ruled out. He is low risk for heart disease.  Past Medical History:  Diagnosis Date   ADD (attention deficit disorder)    Asthma    Hernia     Past Surgical History:  Procedure Laterality Date   HERNIA REPAIR     KNEE SURGERY      Current Medications: No outpatient medications have been marked as taking for the 03/22/22 encounter (Appointment) with Maisie Fus, MD.     Allergies:   Patient has no known allergies.   Social History   Socioeconomic History   Marital status: Married    Spouse name: Not on file   Number of children: Not on file   Years of education: Not on file   Highest education level: Not on file  Occupational History   Not on file  Tobacco Use   Smoking status: Every Day    Packs/day: 1.00    Types: Cigarettes   Smokeless tobacco: Never  Substance and Sexual Activity   Alcohol use: No    Comment: rarely   Drug use: No   Sexual activity: Not on file  Other Topics Concern   Not on file  Social History Narrative   Not on file   Social Determinants of Health   Financial Resource Strain: Not on file  Food Insecurity: Not on file  Transportation Needs: Not on file  Physical Activity: Not on file  Stress: Not on file  Social Connections: Not on file     Family History: The patient's ***family history is not on file.  ROS:   Please see the history of present illness.     *** All other systems reviewed and are negative.  EKGs/Labs/Other Studies Reviewed:    The following studies were reviewed today: ***  EKG:  EKG is *** ordered today.  The ekg ordered today demonstrates ***  Recent Labs: 02/21/2022: ALT 29; BUN 19; Creatinine, Ser 1.02; Hemoglobin 15.1; Platelets 293; Potassium 3.9; Sodium 140  Recent Lipid Panel No results found for: "CHOL", "TRIG", "HDL", "CHOLHDL", "VLDL", "LDLCALC", "LDLDIRECT"   Risk Assessment/Calculations:   {Does this patient have ATRIAL FIBRILLATION?:[phone]}       Physical Exam:    VS:  There were no vitals taken for this visit.    Wt Readings from Last 3 Encounters:  02/21/22 155 lb (70.3 kg)  11/03/21 155 lb (70.3 kg)  08/07/21 155 lb (70.3 kg)     GEN: *** Well nourished, well developed in no acute distress HEENT: Normal NECK: No JVD; No carotid bruits LYMPHATICS: No lymphadenopathy CARDIAC: ***RRR, no murmurs, rubs, gallops RESPIRATORY:  Clear to auscultation without rales, wheezing or rhonchi  ABDOMEN: Soft, non-tender, non-distended MUSCULOSKELETAL:  No edema; No deformity  SKIN: Warm and dry NEUROLOGIC:  Alert and oriented x 3 PSYCHIATRIC:  Normal affect   ASSESSMENT:  Non cardiac CP: symptom occurred 1x and sudden. He did not have prior angina and he has low pretest probability for CVD.  This is non cardiac Cp PLAN:    In order of problems listed above:  No indication for cardiology FU at this time      {Are you ordering a CV Procedure (e.g. stress test, cath, DCCV, TEE, etc)?   Press F2        :[id number]}    Medication Adjustments/Labs and Tests Ordered: Current medicines are reviewed at length with the patient today.  Concerns regarding medicines are outlined above.  No orders of the defined types were placed in this encounter.  No orders of the defined types were placed in this encounter.   There are no Patient Instructions on file for this visit.   Signed, Maisie Fus,  MD  03/22/2022 9:18 AM    [facility]

## 2022-03-29 DIAGNOSIS — R69 Illness, unspecified: Secondary | ICD-10-CM | POA: Diagnosis not present

## 2022-03-29 DIAGNOSIS — F902 Attention-deficit hyperactivity disorder, combined type: Secondary | ICD-10-CM | POA: Diagnosis not present

## 2022-03-29 DIAGNOSIS — F331 Major depressive disorder, recurrent, moderate: Secondary | ICD-10-CM | POA: Diagnosis not present

## 2022-04-05 DIAGNOSIS — F331 Major depressive disorder, recurrent, moderate: Secondary | ICD-10-CM | POA: Diagnosis not present

## 2022-04-05 DIAGNOSIS — R69 Illness, unspecified: Secondary | ICD-10-CM | POA: Diagnosis not present

## 2022-04-05 DIAGNOSIS — F902 Attention-deficit hyperactivity disorder, combined type: Secondary | ICD-10-CM | POA: Diagnosis not present

## 2022-04-12 DIAGNOSIS — F331 Major depressive disorder, recurrent, moderate: Secondary | ICD-10-CM | POA: Diagnosis not present

## 2022-04-12 DIAGNOSIS — R69 Illness, unspecified: Secondary | ICD-10-CM | POA: Diagnosis not present

## 2022-04-12 DIAGNOSIS — F902 Attention-deficit hyperactivity disorder, combined type: Secondary | ICD-10-CM | POA: Diagnosis not present

## 2022-04-14 DIAGNOSIS — R69 Illness, unspecified: Secondary | ICD-10-CM | POA: Diagnosis not present

## 2022-04-14 DIAGNOSIS — F902 Attention-deficit hyperactivity disorder, combined type: Secondary | ICD-10-CM | POA: Diagnosis not present

## 2022-04-14 DIAGNOSIS — F331 Major depressive disorder, recurrent, moderate: Secondary | ICD-10-CM | POA: Diagnosis not present

## 2022-04-19 DIAGNOSIS — F902 Attention-deficit hyperactivity disorder, combined type: Secondary | ICD-10-CM | POA: Diagnosis not present

## 2022-04-19 DIAGNOSIS — F331 Major depressive disorder, recurrent, moderate: Secondary | ICD-10-CM | POA: Diagnosis not present

## 2022-04-19 DIAGNOSIS — R69 Illness, unspecified: Secondary | ICD-10-CM | POA: Diagnosis not present

## 2022-04-26 DIAGNOSIS — F902 Attention-deficit hyperactivity disorder, combined type: Secondary | ICD-10-CM | POA: Diagnosis not present

## 2022-04-26 DIAGNOSIS — R69 Illness, unspecified: Secondary | ICD-10-CM | POA: Diagnosis not present

## 2022-04-26 DIAGNOSIS — F331 Major depressive disorder, recurrent, moderate: Secondary | ICD-10-CM | POA: Diagnosis not present

## 2022-05-03 DIAGNOSIS — F331 Major depressive disorder, recurrent, moderate: Secondary | ICD-10-CM | POA: Diagnosis not present

## 2022-05-03 DIAGNOSIS — R69 Illness, unspecified: Secondary | ICD-10-CM | POA: Diagnosis not present

## 2022-05-03 DIAGNOSIS — F902 Attention-deficit hyperactivity disorder, combined type: Secondary | ICD-10-CM | POA: Diagnosis not present

## 2022-05-10 DIAGNOSIS — F902 Attention-deficit hyperactivity disorder, combined type: Secondary | ICD-10-CM | POA: Diagnosis not present

## 2022-05-10 DIAGNOSIS — F331 Major depressive disorder, recurrent, moderate: Secondary | ICD-10-CM | POA: Diagnosis not present

## 2022-05-10 DIAGNOSIS — R69 Illness, unspecified: Secondary | ICD-10-CM | POA: Diagnosis not present

## 2022-05-17 DIAGNOSIS — R69 Illness, unspecified: Secondary | ICD-10-CM | POA: Diagnosis not present

## 2022-05-17 DIAGNOSIS — F331 Major depressive disorder, recurrent, moderate: Secondary | ICD-10-CM | POA: Diagnosis not present

## 2022-05-17 DIAGNOSIS — F902 Attention-deficit hyperactivity disorder, combined type: Secondary | ICD-10-CM | POA: Diagnosis not present

## 2022-05-31 DIAGNOSIS — R69 Illness, unspecified: Secondary | ICD-10-CM | POA: Diagnosis not present

## 2022-05-31 DIAGNOSIS — F902 Attention-deficit hyperactivity disorder, combined type: Secondary | ICD-10-CM | POA: Diagnosis not present

## 2022-05-31 DIAGNOSIS — F331 Major depressive disorder, recurrent, moderate: Secondary | ICD-10-CM | POA: Diagnosis not present

## 2022-10-24 DIAGNOSIS — F419 Anxiety disorder, unspecified: Secondary | ICD-10-CM | POA: Diagnosis not present

## 2022-10-24 DIAGNOSIS — F17210 Nicotine dependence, cigarettes, uncomplicated: Secondary | ICD-10-CM | POA: Diagnosis not present

## 2022-10-24 DIAGNOSIS — K4020 Bilateral inguinal hernia, without obstruction or gangrene, not specified as recurrent: Secondary | ICD-10-CM | POA: Diagnosis not present

## 2022-10-27 ENCOUNTER — Ambulatory Visit: Payer: Medicaid Other | Admitting: Physician Assistant

## 2022-11-14 ENCOUNTER — Ambulatory Visit: Payer: 59 | Admitting: Physician Assistant

## 2022-11-24 ENCOUNTER — Emergency Department (HOSPITAL_BASED_OUTPATIENT_CLINIC_OR_DEPARTMENT_OTHER)
Admission: EM | Admit: 2022-11-24 | Discharge: 2022-11-25 | Disposition: A | Discharge status: Home / Self Care | Payer: Medicaid Other | Attending: Emergency Medicine | Admitting: Emergency Medicine

## 2022-11-24 ENCOUNTER — Emergency Department (HOSPITAL_BASED_OUTPATIENT_CLINIC_OR_DEPARTMENT_OTHER): Payer: Medicaid Other

## 2022-11-24 ENCOUNTER — Encounter (HOSPITAL_BASED_OUTPATIENT_CLINIC_OR_DEPARTMENT_OTHER): Payer: Self-pay

## 2022-11-24 ENCOUNTER — Other Ambulatory Visit: Payer: Self-pay

## 2022-11-24 DIAGNOSIS — Z1152 Encounter for screening for COVID-19: Secondary | ICD-10-CM | POA: Diagnosis not present

## 2022-11-24 DIAGNOSIS — R062 Wheezing: Secondary | ICD-10-CM | POA: Diagnosis not present

## 2022-11-24 DIAGNOSIS — J029 Acute pharyngitis, unspecified: Secondary | ICD-10-CM | POA: Diagnosis present

## 2022-11-24 DIAGNOSIS — J069 Acute upper respiratory infection, unspecified: Secondary | ICD-10-CM

## 2022-11-24 DIAGNOSIS — R059 Cough, unspecified: Secondary | ICD-10-CM | POA: Diagnosis not present

## 2022-11-24 DIAGNOSIS — M791 Myalgia, unspecified site: Secondary | ICD-10-CM | POA: Diagnosis not present

## 2022-11-24 DIAGNOSIS — B9789 Other viral agents as the cause of diseases classified elsewhere: Secondary | ICD-10-CM | POA: Diagnosis not present

## 2022-11-24 LAB — RESP PANEL BY RT-PCR (RSV, FLU A&B, COVID)  RVPGX2
Influenza A by PCR: NEGATIVE
Influenza B by PCR: NEGATIVE
Resp Syncytial Virus by PCR: NEGATIVE
SARS Coronavirus 2 by RT PCR: NEGATIVE

## 2022-11-24 MED ORDER — LORATADINE 10 MG PO TABS
10.0000 mg | ORAL_TABLET | Freq: Once | ORAL | Status: AC
  Administered 2022-11-24: 10 mg via ORAL
  Filled 2022-11-24: qty 1

## 2022-11-24 MED ORDER — BENZONATATE 100 MG PO CAPS: 100.0000 mg | ORAL_CAPSULE | Freq: Three times a day (TID) | ORAL | 0 refills | Status: DC

## 2022-11-24 MED ORDER — FLUTICASONE PROPIONATE 50 MCG/ACT NA SUSP: 2.0000 | Freq: Every day | NASAL | 0 refills | Status: DC

## 2022-11-24 MED ORDER — ACETAMINOPHEN 500 MG PO TABS
1000.0000 mg | ORAL_TABLET | Freq: Once | ORAL | Status: AC
  Administered 2022-11-24: 1000 mg via ORAL
  Filled 2022-11-24: qty 2

## 2022-11-24 MED ORDER — LIDOCAINE VISCOUS HCL 2 % MT SOLN
15.0000 mL | Freq: Once | OROMUCOSAL | Status: AC
  Administered 2022-11-24: 15 mL via OROMUCOSAL
  Filled 2022-11-24: qty 15

## 2022-11-24 NOTE — ED Provider Notes (Signed)
[facility] [place] Provider Note   CSN: [id number] Arrival date & time: 11/24/22  2251     History  Chief Complaint  Patient presents with   Sore Throat    Samuel Harrison is a 42 y.o. male.  The history is provided by the patient.  Sore Throat This is a new problem. The problem occurs constantly. The problem has not changed since onset.Pertinent negatives include no chest pain. Nothing aggravates the symptoms. Nothing relieves the symptoms. He has tried nothing for the symptoms. The treatment provided no relief.  Flu-like symptoms for 2-3 days.  Body aches, Sore throat, cough.  Has surgery next week at [facility].       Home Medications Prior to Admission medications   Medication Sig Start Date End Date Taking? Authorizing Provider  albuterol (VENTOLIN HFA) 108 (90 Base) MCG/ACT inhaler Use 2 inhalations 5 minutes apart every 4 hours if needed to rescue wheezing 01/11/17 01/11/18  Vladimir Crofts, PA-C  amphetamine-dextroamphetamine (ADDERALL) 20 MG tablet Take 20 mg by mouth daily.    [provider]      Allergies    Patient has no known allergies.    Review of Systems   Review of Systems  Constitutional:  Positive for fever.  HENT:  Positive for sore throat.   Eyes:  Negative for redness.  Respiratory:  Positive for cough.   Cardiovascular:  Negative for chest pain.  Musculoskeletal:  Positive for myalgias.  All other systems reviewed and are negative.   Physical Exam Updated Vital Signs BP (!) 135/94 (BP Location: Right Arm)   Pulse (!) 114   Temp 99.9 F (37.7 C)   Resp 18   Ht '5\' 6"'$  (1.676 m)   Wt 70.3 kg   SpO2 97%   BMI 25.02 kg/m  Physical Exam Vitals and nursing note reviewed.  Constitutional:      General: He is not in acute distress.    Appearance: Normal appearance. He is well-developed. He is not diaphoretic.  HENT:     Head: Normocephalic and atraumatic.     Nose: Nose normal.  Eyes:      Conjunctiva/sclera: Conjunctivae normal.     Pupils: Pupils are equal, round, and reactive to light.  Cardiovascular:     Rate and Rhythm: Normal rate and regular rhythm.     Pulses: Normal pulses.     Heart sounds: Normal heart sounds.  Pulmonary:     Effort: Pulmonary effort is normal.     Breath sounds: Normal breath sounds. No wheezing or rales.  Abdominal:     General: Bowel sounds are normal.     Palpations: Abdomen is soft.     Tenderness: There is no abdominal tenderness. There is no guarding or rebound.  Musculoskeletal:        General: Normal range of motion.     Cervical back: Normal range of motion and neck supple.  Lymphadenopathy:     Cervical: No cervical adenopathy.  Skin:    General: Skin is warm and dry.     Capillary Refill: Capillary refill takes less than 2 seconds.  Neurological:     General: No focal deficit present.     Mental Status: He is alert and oriented to person, place, and time.     Deep Tendon Reflexes: Reflexes normal.  Psychiatric:        Mood and Affect: Mood normal.        Behavior: Behavior normal.  ED Results / Procedures / Treatments   Labs (all labs ordered are listed, but only abnormal results are displayed) Labs Reviewed  RESP PANEL BY RT-PCR (RSV, FLU A&B, COVID)  [id number]    EKG None  Radiology No results found.  Procedures Procedures    Medications Ordered in ED Medications  acetaminophen (TYLENOL) tablet 1,000 mg (has no administration in time range)  loratadine (CLARITIN) tablet 10 mg (has no administration in time range)    ED Course/ Medical Decision Making/ A&P                             Medical Decision Making Cough, sore throat and body aches for 2-3 days   Amount and/or Complexity of Data Reviewed External Data Reviewed: notes.    Details: Previous notes reviewed  Labs: ordered.    Details: Covid and flu  Radiology: ordered and independent interpretation performed.    Details: Negative  CXR  Risk OTC drugs. Prescription drug management. Risk Details: Patient with cough, sore throat congestion and body aches for 2-3 days.  No documented fever over 100.4.  It has not been 10 days.  This is not a sinus infection.  Constellation of symptoms is viral.  No PNA on CXR.  Stable for discharge.  Strict return.      Final Clinical Impression(s) / ED Diagnoses Final diagnoses:  None   Return for intractable cough, coughing up blood, fevers > 100.4 unrelieved by medication, shortness of breath, intractable vomiting, chest pain, shortness of breath, weakness, numbness, changes in speech, facial asymmetry, abdominal pain, passing out, Inability to tolerate liquids or food, cough, altered mental status or any concerns. No signs of systemic illness or infection. The patient is nontoxic-appearing on exam and vital signs are within normal limits.  I have reviewed the triage vital signs and the nursing notes. Pertinent labs & imaging results that were available during my care of the patient were reviewed by me and considered in my medical decision making (see chart for details). After history, exam, and medical workup I feel the patient has been appropriately medically screened and is safe for discharge home. Pertinent diagnoses were discussed with the patient. Patient was given return precautions. Rx / DC Orders ED Discharge Orders     None         Captain Blucher, MD 11/24/22 2348

## 2022-11-24 NOTE — ED Triage Notes (Signed)
Pt to ED by POV from home with c/o sore throat, fever, chills & productive cough onset of 2-3 days ago. Pt is scheduled for surgery at [facility] next week for a hernia repair and is concerned they will cancel if he is ill. Arrives A+O, VSS, NADN.

## 2022-11-25 MED ORDER — FLUTICASONE PROPIONATE 50 MCG/ACT NA SUSP: 2.0000 | Freq: Every day | NASAL | 0 refills | Status: AC

## 2022-11-25 MED ORDER — BENZONATATE 100 MG PO CAPS: 100.0000 mg | ORAL_CAPSULE | Freq: Three times a day (TID) | ORAL | 0 refills | Status: AC

## 2022-12-01 ENCOUNTER — Ambulatory Visit: Payer: Medicaid Other

## 2022-12-01 DIAGNOSIS — J0100 Acute maxillary sinusitis, unspecified: Secondary | ICD-10-CM

## 2022-12-01 NOTE — Progress Notes (Addendum)
Pt came in for  new patient appt but could not be seen due to being late. Was seen as nurse visit due to circumstance and went over patient medication and verified pharmacy with patient for refills. Pt is needing albuterol inhaler and Adderall refilled. Pt is having a double hernia repair done tomorrow.

## 2022-12-02 DIAGNOSIS — K4020 Bilateral inguinal hernia, without obstruction or gangrene, not specified as recurrent: Secondary | ICD-10-CM | POA: Diagnosis not present

## 2022-12-19 DIAGNOSIS — Z09 Encounter for follow-up examination after completed treatment for conditions other than malignant neoplasm: Secondary | ICD-10-CM | POA: Diagnosis not present

## 2022-12-19 DIAGNOSIS — Z48815 Encounter for surgical aftercare following surgery on the digestive system: Secondary | ICD-10-CM | POA: Diagnosis not present

## 2022-12-19 DIAGNOSIS — Z8719 Personal history of other diseases of the digestive system: Secondary | ICD-10-CM | POA: Diagnosis not present

## 2022-12-22 ENCOUNTER — Ambulatory Visit: Payer: Medicaid Other | Admitting: Physician Assistant

## 2023-01-16 DIAGNOSIS — Z7689 Persons encountering health services in other specified circumstances: Secondary | ICD-10-CM | POA: Diagnosis not present

## 2023-01-16 DIAGNOSIS — F909 Attention-deficit hyperactivity disorder, unspecified type: Secondary | ICD-10-CM | POA: Diagnosis not present

## 2023-01-23 DIAGNOSIS — Z48815 Encounter for surgical aftercare following surgery on the digestive system: Secondary | ICD-10-CM | POA: Diagnosis not present

## 2023-01-23 DIAGNOSIS — R1031 Right lower quadrant pain: Secondary | ICD-10-CM | POA: Diagnosis not present

## 2023-01-23 DIAGNOSIS — Z09 Encounter for follow-up examination after completed treatment for conditions other than malignant neoplasm: Secondary | ICD-10-CM | POA: Diagnosis not present

## 2023-01-23 DIAGNOSIS — Z8719 Personal history of other diseases of the digestive system: Secondary | ICD-10-CM | POA: Diagnosis not present

## 2023-01-23 DIAGNOSIS — F17210 Nicotine dependence, cigarettes, uncomplicated: Secondary | ICD-10-CM | POA: Diagnosis not present

## 2023-01-31 DIAGNOSIS — M5030 Other cervical disc degeneration, unspecified cervical region: Secondary | ICD-10-CM | POA: Diagnosis not present

## 2023-01-31 DIAGNOSIS — M542 Cervicalgia: Secondary | ICD-10-CM | POA: Diagnosis not present

## 2023-01-31 DIAGNOSIS — M47812 Spondylosis without myelopathy or radiculopathy, cervical region: Secondary | ICD-10-CM | POA: Diagnosis not present

## 2023-02-02 DIAGNOSIS — F320 Major depressive disorder, single episode, mild: Secondary | ICD-10-CM | POA: Diagnosis not present

## 2023-02-02 DIAGNOSIS — F900 Attention-deficit hyperactivity disorder, predominantly inattentive type: Secondary | ICD-10-CM | POA: Diagnosis not present

## 2023-02-02 DIAGNOSIS — F411 Generalized anxiety disorder: Secondary | ICD-10-CM | POA: Diagnosis not present

## 2023-02-28 DIAGNOSIS — F320 Major depressive disorder, single episode, mild: Secondary | ICD-10-CM | POA: Diagnosis not present

## 2023-02-28 DIAGNOSIS — F900 Attention-deficit hyperactivity disorder, predominantly inattentive type: Secondary | ICD-10-CM | POA: Diagnosis not present

## 2023-02-28 DIAGNOSIS — F411 Generalized anxiety disorder: Secondary | ICD-10-CM | POA: Diagnosis not present

## 2023-03-08 DIAGNOSIS — F411 Generalized anxiety disorder: Secondary | ICD-10-CM | POA: Diagnosis not present

## 2023-03-08 DIAGNOSIS — F332 Major depressive disorder, recurrent severe without psychotic features: Secondary | ICD-10-CM | POA: Diagnosis not present

## 2023-03-08 DIAGNOSIS — F900 Attention-deficit hyperactivity disorder, predominantly inattentive type: Secondary | ICD-10-CM | POA: Diagnosis not present

## 2023-03-21 DIAGNOSIS — F900 Attention-deficit hyperactivity disorder, predominantly inattentive type: Secondary | ICD-10-CM | POA: Diagnosis not present

## 2023-03-21 DIAGNOSIS — F332 Major depressive disorder, recurrent severe without psychotic features: Secondary | ICD-10-CM | POA: Diagnosis not present

## 2023-03-21 DIAGNOSIS — F411 Generalized anxiety disorder: Secondary | ICD-10-CM | POA: Diagnosis not present

## 2023-03-22 DIAGNOSIS — F411 Generalized anxiety disorder: Secondary | ICD-10-CM | POA: Diagnosis not present

## 2023-03-22 DIAGNOSIS — F332 Major depressive disorder, recurrent severe without psychotic features: Secondary | ICD-10-CM | POA: Diagnosis not present

## 2023-03-22 DIAGNOSIS — F900 Attention-deficit hyperactivity disorder, predominantly inattentive type: Secondary | ICD-10-CM | POA: Diagnosis not present

## 2023-03-27 DIAGNOSIS — F332 Major depressive disorder, recurrent severe without psychotic features: Secondary | ICD-10-CM | POA: Diagnosis not present

## 2023-03-27 DIAGNOSIS — F900 Attention-deficit hyperactivity disorder, predominantly inattentive type: Secondary | ICD-10-CM | POA: Diagnosis not present

## 2023-03-27 DIAGNOSIS — F411 Generalized anxiety disorder: Secondary | ICD-10-CM | POA: Diagnosis not present

## 2023-03-28 DIAGNOSIS — F320 Major depressive disorder, single episode, mild: Secondary | ICD-10-CM | POA: Diagnosis not present

## 2023-03-28 DIAGNOSIS — F900 Attention-deficit hyperactivity disorder, predominantly inattentive type: Secondary | ICD-10-CM | POA: Diagnosis not present

## 2023-03-28 DIAGNOSIS — F411 Generalized anxiety disorder: Secondary | ICD-10-CM | POA: Diagnosis not present

## 2023-04-05 DIAGNOSIS — F900 Attention-deficit hyperactivity disorder, predominantly inattentive type: Secondary | ICD-10-CM | POA: Diagnosis not present

## 2023-04-05 DIAGNOSIS — F332 Major depressive disorder, recurrent severe without psychotic features: Secondary | ICD-10-CM | POA: Diagnosis not present

## 2023-04-05 DIAGNOSIS — F411 Generalized anxiety disorder: Secondary | ICD-10-CM | POA: Diagnosis not present

## 2023-04-24 DIAGNOSIS — F332 Major depressive disorder, recurrent severe without psychotic features: Secondary | ICD-10-CM | POA: Diagnosis not present

## 2023-04-24 DIAGNOSIS — F411 Generalized anxiety disorder: Secondary | ICD-10-CM | POA: Diagnosis not present

## 2023-04-24 DIAGNOSIS — F900 Attention-deficit hyperactivity disorder, predominantly inattentive type: Secondary | ICD-10-CM | POA: Diagnosis not present

## 2023-04-25 DIAGNOSIS — F320 Major depressive disorder, single episode, mild: Secondary | ICD-10-CM | POA: Diagnosis not present

## 2023-04-25 DIAGNOSIS — F411 Generalized anxiety disorder: Secondary | ICD-10-CM | POA: Diagnosis not present

## 2023-04-25 DIAGNOSIS — F900 Attention-deficit hyperactivity disorder, predominantly inattentive type: Secondary | ICD-10-CM | POA: Diagnosis not present

## 2023-05-11 DIAGNOSIS — F900 Attention-deficit hyperactivity disorder, predominantly inattentive type: Secondary | ICD-10-CM | POA: Diagnosis not present

## 2023-05-11 DIAGNOSIS — F411 Generalized anxiety disorder: Secondary | ICD-10-CM | POA: Diagnosis not present

## 2023-05-11 DIAGNOSIS — F332 Major depressive disorder, recurrent severe without psychotic features: Secondary | ICD-10-CM | POA: Diagnosis not present

## 2023-05-23 DIAGNOSIS — F411 Generalized anxiety disorder: Secondary | ICD-10-CM | POA: Diagnosis not present

## 2023-05-23 DIAGNOSIS — F900 Attention-deficit hyperactivity disorder, predominantly inattentive type: Secondary | ICD-10-CM | POA: Diagnosis not present

## 2023-05-23 DIAGNOSIS — F320 Major depressive disorder, single episode, mild: Secondary | ICD-10-CM | POA: Diagnosis not present

## 2023-05-24 ENCOUNTER — Telehealth: Payer: Self-pay

## 2023-05-24 NOTE — Telephone Encounter (Signed)
  Medicaid Managed Care   Unsuccessful Outreach Note  05/24/2023 Name: Sepehr Plyler MRN: [id number] DOB: Mar 09, 1981  Referred by: No primary care provider on file. Reason for referral : No chief complaint on file.   An unsuccessful telephone outreach was attempted today. The patient was referred to the case management team for assistance with care management and care coordination.   Follow Up Plan: If patient returns call to provider office, please advise to call Embedded Care Management Care Guide Nicholes Rough* at [phone]*  Nicholes Rough, CMA Care Guide [employer]

## 2023-05-25 DIAGNOSIS — F411 Generalized anxiety disorder: Secondary | ICD-10-CM | POA: Diagnosis not present

## 2023-05-25 DIAGNOSIS — F332 Major depressive disorder, recurrent severe without psychotic features: Secondary | ICD-10-CM | POA: Diagnosis not present

## 2023-05-25 DIAGNOSIS — F900 Attention-deficit hyperactivity disorder, predominantly inattentive type: Secondary | ICD-10-CM | POA: Diagnosis not present

## 2023-06-05 DIAGNOSIS — Z8709 Personal history of other diseases of the respiratory system: Secondary | ICD-10-CM | POA: Diagnosis not present

## 2023-06-05 DIAGNOSIS — R051 Acute cough: Secondary | ICD-10-CM | POA: Diagnosis not present

## 2023-06-05 DIAGNOSIS — J0100 Acute maxillary sinusitis, unspecified: Secondary | ICD-10-CM | POA: Diagnosis not present

## 2023-06-08 DIAGNOSIS — F411 Generalized anxiety disorder: Secondary | ICD-10-CM | POA: Diagnosis not present

## 2023-06-08 DIAGNOSIS — F900 Attention-deficit hyperactivity disorder, predominantly inattentive type: Secondary | ICD-10-CM | POA: Diagnosis not present

## 2023-06-08 DIAGNOSIS — F332 Major depressive disorder, recurrent severe without psychotic features: Secondary | ICD-10-CM | POA: Diagnosis not present

## 2023-06-15 DIAGNOSIS — F411 Generalized anxiety disorder: Secondary | ICD-10-CM | POA: Diagnosis not present

## 2023-06-15 DIAGNOSIS — F900 Attention-deficit hyperactivity disorder, predominantly inattentive type: Secondary | ICD-10-CM | POA: Diagnosis not present

## 2023-06-15 DIAGNOSIS — F332 Major depressive disorder, recurrent severe without psychotic features: Secondary | ICD-10-CM | POA: Diagnosis not present

## 2023-06-20 DIAGNOSIS — F332 Major depressive disorder, recurrent severe without psychotic features: Secondary | ICD-10-CM | POA: Diagnosis not present

## 2023-06-20 DIAGNOSIS — F900 Attention-deficit hyperactivity disorder, predominantly inattentive type: Secondary | ICD-10-CM | POA: Diagnosis not present

## 2023-06-20 DIAGNOSIS — F411 Generalized anxiety disorder: Secondary | ICD-10-CM | POA: Diagnosis not present

## 2023-06-22 DIAGNOSIS — F900 Attention-deficit hyperactivity disorder, predominantly inattentive type: Secondary | ICD-10-CM | POA: Diagnosis not present

## 2023-06-22 DIAGNOSIS — F411 Generalized anxiety disorder: Secondary | ICD-10-CM | POA: Diagnosis not present

## 2023-06-22 DIAGNOSIS — F332 Major depressive disorder, recurrent severe without psychotic features: Secondary | ICD-10-CM | POA: Diagnosis not present

## 2023-06-29 DIAGNOSIS — F411 Generalized anxiety disorder: Secondary | ICD-10-CM | POA: Diagnosis not present

## 2023-06-29 DIAGNOSIS — F900 Attention-deficit hyperactivity disorder, predominantly inattentive type: Secondary | ICD-10-CM | POA: Diagnosis not present

## 2023-06-29 DIAGNOSIS — F332 Major depressive disorder, recurrent severe without psychotic features: Secondary | ICD-10-CM | POA: Diagnosis not present

## 2023-07-06 DIAGNOSIS — F900 Attention-deficit hyperactivity disorder, predominantly inattentive type: Secondary | ICD-10-CM | POA: Diagnosis not present

## 2023-07-06 DIAGNOSIS — F411 Generalized anxiety disorder: Secondary | ICD-10-CM | POA: Diagnosis not present

## 2023-07-06 DIAGNOSIS — F332 Major depressive disorder, recurrent severe without psychotic features: Secondary | ICD-10-CM | POA: Diagnosis not present

## 2023-07-10 DIAGNOSIS — F332 Major depressive disorder, recurrent severe without psychotic features: Secondary | ICD-10-CM | POA: Diagnosis not present

## 2023-07-10 DIAGNOSIS — F900 Attention-deficit hyperactivity disorder, predominantly inattentive type: Secondary | ICD-10-CM | POA: Diagnosis not present

## 2023-07-10 DIAGNOSIS — F411 Generalized anxiety disorder: Secondary | ICD-10-CM | POA: Diagnosis not present

## 2023-07-13 DIAGNOSIS — F411 Generalized anxiety disorder: Secondary | ICD-10-CM | POA: Diagnosis not present

## 2023-07-13 DIAGNOSIS — F900 Attention-deficit hyperactivity disorder, predominantly inattentive type: Secondary | ICD-10-CM | POA: Diagnosis not present

## 2023-07-13 DIAGNOSIS — F332 Major depressive disorder, recurrent severe without psychotic features: Secondary | ICD-10-CM | POA: Diagnosis not present

## 2023-07-18 DIAGNOSIS — F900 Attention-deficit hyperactivity disorder, predominantly inattentive type: Secondary | ICD-10-CM | POA: Diagnosis not present

## 2023-07-18 DIAGNOSIS — F411 Generalized anxiety disorder: Secondary | ICD-10-CM | POA: Diagnosis not present

## 2023-07-18 DIAGNOSIS — F332 Major depressive disorder, recurrent severe without psychotic features: Secondary | ICD-10-CM | POA: Diagnosis not present

## 2023-07-20 DIAGNOSIS — F332 Major depressive disorder, recurrent severe without psychotic features: Secondary | ICD-10-CM | POA: Diagnosis not present

## 2023-07-25 DIAGNOSIS — F332 Major depressive disorder, recurrent severe without psychotic features: Secondary | ICD-10-CM | POA: Diagnosis not present

## 2023-07-27 DIAGNOSIS — F332 Major depressive disorder, recurrent severe without psychotic features: Secondary | ICD-10-CM | POA: Diagnosis not present

## 2023-08-03 ENCOUNTER — Emergency Department (HOSPITAL_BASED_OUTPATIENT_CLINIC_OR_DEPARTMENT_OTHER)
Admission: EM | Admit: 2023-08-03 | Discharge: 2023-08-04 | Disposition: A | Discharge status: Home / Self Care | Payer: Medicaid Other | Attending: Emergency Medicine | Admitting: Emergency Medicine

## 2023-08-03 ENCOUNTER — Other Ambulatory Visit: Payer: Self-pay

## 2023-08-03 ENCOUNTER — Encounter (HOSPITAL_BASED_OUTPATIENT_CLINIC_OR_DEPARTMENT_OTHER): Payer: Self-pay | Admitting: Emergency Medicine

## 2023-08-03 DIAGNOSIS — F332 Major depressive disorder, recurrent severe without psychotic features: Secondary | ICD-10-CM | POA: Diagnosis not present

## 2023-08-03 DIAGNOSIS — R1031 Right lower quadrant pain: Secondary | ICD-10-CM | POA: Diagnosis not present

## 2023-08-03 DIAGNOSIS — R109 Unspecified abdominal pain: Secondary | ICD-10-CM

## 2023-08-03 DIAGNOSIS — B353 Tinea pedis: Secondary | ICD-10-CM | POA: Diagnosis not present

## 2023-08-03 LAB — URINALYSIS, ROUTINE W REFLEX MICROSCOPIC
Bilirubin Urine: NEGATIVE
Glucose, UA: NEGATIVE mg/dL
Hgb urine dipstick: NEGATIVE
Ketones, ur: NEGATIVE mg/dL
Leukocytes,Ua: NEGATIVE
Nitrite: NEGATIVE
Protein, ur: NEGATIVE mg/dL
Specific Gravity, Urine: >=1.03 (ref 1.005–1.030)
pH: 6.5 (ref 5.0–8.0)

## 2023-08-03 NOTE — ED Notes (Signed)
Hold on labs per MD. Pt will have CT study first.

## 2023-08-03 NOTE — ED Triage Notes (Signed)
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

## 2023-08-04 ENCOUNTER — Emergency Department (HOSPITAL_BASED_OUTPATIENT_CLINIC_OR_DEPARTMENT_OTHER): Payer: Medicaid Other

## 2023-08-04 DIAGNOSIS — R109 Unspecified abdominal pain: Secondary | ICD-10-CM | POA: Diagnosis not present

## 2023-08-04 MED ORDER — NAPROXEN 250 MG PO TABS
500.0000 mg | ORAL_TABLET | Freq: Once | ORAL | Status: AC
  Administered 2023-08-04: 500 mg via ORAL
  Filled 2023-08-04: qty 2

## 2023-08-04 MED ORDER — NAPROXEN 500 MG PO TABS: 500.0000 mg | ORAL_TABLET | Freq: Two times a day (BID) | ORAL | 0 refills | Status: AC

## 2023-08-04 NOTE — ED Provider Notes (Signed)
[place] [facility] Provider Note   CSN: [id number] Arrival date & time: 08/03/23  2319     History  Chief Complaint  Patient presents with   Abdominal Pain   Headache    Samuel Harrison is a 42 y.o. male.  The history is provided by the patient.  Abdominal Pain Pain location:  RLQ (along the right inguinal canal, has had previous hernia repair at [facility] for an inguina hernia and is concerned that there is a recurrence) Pain quality: fullness   Pain radiates to:  Does not radiate Pain severity:  Moderate Onset quality:  Gradual Duration:  4 weeks Timing:  Constant Progression:  Unchanged Chronicity:  New Context: not trauma   Relieved by:  Nothing Worsened by:  Nothing Ineffective treatments:  None tried Associated symptoms: no diarrhea, no fever and no vomiting   Associated symptoms comment:  Also has skin breakdown between his toes  Risk factors: no recent hospitalization        Home Medications Prior to Admission medications   Medication Sig Start Date End Date Taking? Authorizing Provider  naproxen (NAPROSYN) 500 MG tablet Take 1 tablet (500 mg total) by mouth 2 (two) times daily with a meal. 08/04/23  Yes Sylva Overley, MD  albuterol (VENTOLIN HFA) 108 (90 Base) MCG/ACT inhaler Use 2 inhalations 5 minutes apart every 4 hours if needed to rescue wheezing 01/11/17 01/11/18  Doree Albee, PA-C  amphetamine-dextroamphetamine (ADDERALL) 20 MG tablet Take 20 mg by mouth daily.    [provider]  benzonatate (TESSALON) 100 MG capsule Take 1 capsule (100 mg total) by mouth every 8 (eight) hours. 11/25/22   Jaelah Hauth, MD  clonazePAM (KLONOPIN) 0.5 MG tablet Take 0.5 mg by mouth daily. 04/29/22   [provider]  fluticasone (FLONASE) 50 MCG/ACT nasal spray Place 2 sprays into both nostrils daily. 11/25/22   Estanislao Harmon, MD      Allergies    Patient has no known allergies.    Review of Systems    Review of Systems  Constitutional:  Negative for fever.  HENT:  Negative for facial swelling.   Respiratory:  Negative for wheezing and stridor.   Gastrointestinal:  Positive for abdominal pain. Negative for diarrhea and vomiting.  All other systems reviewed and are negative.   Physical Exam Updated Vital Signs BP 131/89 (BP Location: Left Arm)   Pulse 96   Temp 99 F (37.2 C) (Oral)   Resp 16   Ht 5\' 6"  (1.676 m)   Wt 68 kg   SpO2 100%   BMI 24.21 kg/m  Physical Exam Vitals and nursing note reviewed. Exam conducted with a chaperone present.  Constitutional:      General: He is not in acute distress.    Appearance: He is well-developed. He is not diaphoretic.  HENT:     Head: Normocephalic and atraumatic.     Nose: Nose normal.  Eyes:     Conjunctiva/sclera: Conjunctivae normal.     Pupils: Pupils are equal, round, and reactive to light.  Cardiovascular:     Rate and Rhythm: Normal rate and regular rhythm.     Pulses: Normal pulses.     Heart sounds: Normal heart sounds.  Pulmonary:     Effort: Pulmonary effort is normal.     Breath sounds: Normal breath sounds. No wheezing or rales.  Abdominal:     General: Bowel sounds are normal.     Palpations: Abdomen is soft.  There is no mass.     Tenderness: There is no abdominal tenderness. There is no guarding or rebound.     Hernia: No hernia is present.  Musculoskeletal:        General: Normal range of motion.     Cervical back: Normal range of motion and neck supple.  Skin:    General: Skin is warm and dry.     Capillary Refill: Capillary refill takes less than 2 seconds.     Comments: Athletes foot between the toes of the right foot   Neurological:     General: No focal deficit present.     Mental Status: He is alert and oriented to person, place, and time.  Psychiatric:        Thought Content: Thought content normal.     ED Results / Procedures / Treatments   Labs (all labs ordered are listed, but only  abnormal results are displayed) Labs Reviewed  URINALYSIS, ROUTINE W REFLEX MICROSCOPIC  COMPREHENSIVE METABOLIC PANEL  CBC    EKG None  Radiology CT Renal Stone Study  Result Date: 08/04/2023 CLINICAL DATA:  Abdominal/flank pain, stone suspected. EXAM: CT ABDOMEN AND PELVIS WITHOUT CONTRAST TECHNIQUE: Multidetector CT imaging of the abdomen and pelvis was performed following the standard protocol without IV contrast. RADIATION DOSE REDUCTION: This exam was performed according to the departmental dose-optimization program which includes automated exposure control, adjustment of the mA and/or kV according to patient size and/or use of iterative reconstruction technique. COMPARISON:  12/21/2003. FINDINGS: Lower chest: No acute abnormality. Hepatobiliary: No focal liver abnormality is seen. No gallstones, gallbladder wall thickening, or biliary dilatation. Pancreas: Unremarkable. No pancreatic ductal dilatation or surrounding inflammatory changes. Spleen: Normal in size without focal abnormality. Adrenals/Urinary Tract: The adrenal glands are within normal limits. No renal calculus or hydronephrosis bilaterally. No ureteral calculus or obstructive uropathy bilaterally. Phleboliths are present in the pelvis on the left. The bladder is unremarkable. Stomach/Bowel: Stomach is within normal limits. Appendix appears normal. No evidence of bowel wall thickening, distention, or inflammatory changes. No free air or pneumatosis. Vascular/Lymphatic: No significant vascular findings are present. No enlarged abdominal or pelvic lymph nodes. Reproductive: Prostate is unremarkable. Other: No abdominopelvic ascites. Musculoskeletal: Mild degenerative changes are noted in the thoracolumbar spine. No acute osseous abnormality. IMPRESSION: No acute intra-abdominal process. Electronically Signed   By: Thornell Sartorius M.D.   On: 08/04/2023 00:27    Procedures Procedures    Medications Ordered in ED Medications   naproxen (NAPROSYN) tablet 500 mg (has no administration in time range)    ED Course/ Medical Decision Making/ A&P                                 Medical Decision Making Patient with concerns for recurrent abdominal wall hernia   Amount and/or Complexity of Data Reviewed External Data Reviewed: notes.    Details: Previous notes reviewed Labs: ordered. Radiology: ordered and independent interpretation performed.    Details: CT is negative for hernia   Risk Prescription drug management. Risk Details: Patient does not have recurrent hernias and foot lesions are consistent with athlete's foot.  NSAIDs for pain and tinactin and clean white socks for athletes foot.  Stable for discharge.  Strict return    Final Clinical Impression(s) / ED Diagnoses Final diagnoses:  Abdominal wall pain  Tinea pedis, unspecified laterality       Return for intractable cough, coughing up blood,  fevers > 100.4 unrelieved by medication, shortness of breath, intractable vomiting, chest pain, shortness of breath, weakness, numbness, changes in speech, facial asymmetry, abdominal pain, passing out, Inability to tolerate liquids or food, cough, altered mental status or any concerns. No signs of systemic illness or infection. The patient is nontoxic-appearing on exam and vital signs are within normal limits.  I have reviewed the triage vital signs and the nursing notes. Pertinent labs & imaging results that were available during my care of the patient were reviewed by me and considered in my medical decision making (see chart for details). After history, exam, and medical workup I feel the patient has been appropriately medically screened and is safe for discharge home. Pertinent diagnoses were discussed with the patient. Patient was given return precautions.  Rx / DC Orders ED Discharge Orders          Ordered    naproxen (NAPROSYN) 500 MG tablet  2 times daily with meals        08/04/23 0044               Marykathleen Russi, MD 08/04/23 4098

## 2023-08-07 DIAGNOSIS — B353 Tinea pedis: Secondary | ICD-10-CM | POA: Diagnosis not present

## 2023-08-09 DIAGNOSIS — F332 Major depressive disorder, recurrent severe without psychotic features: Secondary | ICD-10-CM | POA: Diagnosis not present

## 2023-08-15 DIAGNOSIS — F332 Major depressive disorder, recurrent severe without psychotic features: Secondary | ICD-10-CM | POA: Diagnosis not present

## 2023-08-17 DIAGNOSIS — L989 Disorder of the skin and subcutaneous tissue, unspecified: Secondary | ICD-10-CM | POA: Diagnosis not present

## 2023-08-17 DIAGNOSIS — Z1159 Encounter for screening for other viral diseases: Secondary | ICD-10-CM | POA: Diagnosis not present

## 2023-08-17 DIAGNOSIS — F332 Major depressive disorder, recurrent severe without psychotic features: Secondary | ICD-10-CM | POA: Diagnosis not present

## 2023-08-17 DIAGNOSIS — B353 Tinea pedis: Secondary | ICD-10-CM | POA: Diagnosis not present

## 2023-08-17 DIAGNOSIS — M79672 Pain in left foot: Secondary | ICD-10-CM | POA: Diagnosis not present

## 2023-08-17 DIAGNOSIS — M79671 Pain in right foot: Secondary | ICD-10-CM | POA: Diagnosis not present

## 2023-08-17 DIAGNOSIS — R5383 Other fatigue: Secondary | ICD-10-CM | POA: Diagnosis not present

## 2023-08-24 DIAGNOSIS — F332 Major depressive disorder, recurrent severe without psychotic features: Secondary | ICD-10-CM | POA: Diagnosis not present

## 2023-08-29 DIAGNOSIS — F332 Major depressive disorder, recurrent severe without psychotic features: Secondary | ICD-10-CM | POA: Diagnosis not present

## 2023-08-31 DIAGNOSIS — F332 Major depressive disorder, recurrent severe without psychotic features: Secondary | ICD-10-CM | POA: Diagnosis not present

## 2023-09-01 DIAGNOSIS — R748 Abnormal levels of other serum enzymes: Secondary | ICD-10-CM | POA: Diagnosis not present

## 2023-09-05 DIAGNOSIS — M79672 Pain in left foot: Secondary | ICD-10-CM | POA: Diagnosis not present

## 2023-09-05 DIAGNOSIS — M79671 Pain in right foot: Secondary | ICD-10-CM | POA: Diagnosis not present

## 2023-09-20 DIAGNOSIS — J208 Acute bronchitis due to other specified organisms: Secondary | ICD-10-CM | POA: Diagnosis not present

## 2023-09-20 DIAGNOSIS — B9689 Other specified bacterial agents as the cause of diseases classified elsewhere: Secondary | ICD-10-CM | POA: Diagnosis not present

## 2023-09-21 DIAGNOSIS — F332 Major depressive disorder, recurrent severe without psychotic features: Secondary | ICD-10-CM | POA: Diagnosis not present

## 2023-09-25 DIAGNOSIS — F332 Major depressive disorder, recurrent severe without psychotic features: Secondary | ICD-10-CM | POA: Diagnosis not present

## 2023-09-28 DIAGNOSIS — F332 Major depressive disorder, recurrent severe without psychotic features: Secondary | ICD-10-CM | POA: Diagnosis not present

## 2023-10-02 DIAGNOSIS — F332 Major depressive disorder, recurrent severe without psychotic features: Secondary | ICD-10-CM | POA: Diagnosis not present

## 2023-10-02 DIAGNOSIS — F411 Generalized anxiety disorder: Secondary | ICD-10-CM | POA: Diagnosis not present

## 2023-10-02 DIAGNOSIS — F900 Attention-deficit hyperactivity disorder, predominantly inattentive type: Secondary | ICD-10-CM | POA: Diagnosis not present

## 2023-10-05 DIAGNOSIS — F332 Major depressive disorder, recurrent severe without psychotic features: Secondary | ICD-10-CM | POA: Diagnosis not present

## 2023-10-05 DIAGNOSIS — F900 Attention-deficit hyperactivity disorder, predominantly inattentive type: Secondary | ICD-10-CM | POA: Diagnosis not present

## 2023-10-05 DIAGNOSIS — F411 Generalized anxiety disorder: Secondary | ICD-10-CM | POA: Diagnosis not present

## 2023-10-10 DIAGNOSIS — F332 Major depressive disorder, recurrent severe without psychotic features: Secondary | ICD-10-CM | POA: Diagnosis not present

## 2023-10-10 DIAGNOSIS — F411 Generalized anxiety disorder: Secondary | ICD-10-CM | POA: Diagnosis not present

## 2023-10-10 DIAGNOSIS — F900 Attention-deficit hyperactivity disorder, predominantly inattentive type: Secondary | ICD-10-CM | POA: Diagnosis not present

## 2023-10-17 DIAGNOSIS — F411 Generalized anxiety disorder: Secondary | ICD-10-CM | POA: Diagnosis not present

## 2023-10-17 DIAGNOSIS — F332 Major depressive disorder, recurrent severe without psychotic features: Secondary | ICD-10-CM | POA: Diagnosis not present

## 2023-10-17 DIAGNOSIS — F900 Attention-deficit hyperactivity disorder, predominantly inattentive type: Secondary | ICD-10-CM | POA: Diagnosis not present

## 2023-10-19 DIAGNOSIS — F900 Attention-deficit hyperactivity disorder, predominantly inattentive type: Secondary | ICD-10-CM | POA: Diagnosis not present

## 2023-10-19 DIAGNOSIS — F332 Major depressive disorder, recurrent severe without psychotic features: Secondary | ICD-10-CM | POA: Diagnosis not present

## 2023-10-19 DIAGNOSIS — F411 Generalized anxiety disorder: Secondary | ICD-10-CM | POA: Diagnosis not present

## 2023-11-02 DIAGNOSIS — F332 Major depressive disorder, recurrent severe without psychotic features: Secondary | ICD-10-CM | POA: Diagnosis not present

## 2023-11-02 DIAGNOSIS — F411 Generalized anxiety disorder: Secondary | ICD-10-CM | POA: Diagnosis not present

## 2023-11-02 DIAGNOSIS — F900 Attention-deficit hyperactivity disorder, predominantly inattentive type: Secondary | ICD-10-CM | POA: Diagnosis not present

## 2023-11-09 DIAGNOSIS — F411 Generalized anxiety disorder: Secondary | ICD-10-CM | POA: Diagnosis not present

## 2023-11-09 DIAGNOSIS — F900 Attention-deficit hyperactivity disorder, predominantly inattentive type: Secondary | ICD-10-CM | POA: Diagnosis not present

## 2023-11-09 DIAGNOSIS — F332 Major depressive disorder, recurrent severe without psychotic features: Secondary | ICD-10-CM | POA: Diagnosis not present

## 2023-11-16 DIAGNOSIS — F900 Attention-deficit hyperactivity disorder, predominantly inattentive type: Secondary | ICD-10-CM | POA: Diagnosis not present

## 2023-11-16 DIAGNOSIS — F411 Generalized anxiety disorder: Secondary | ICD-10-CM | POA: Diagnosis not present

## 2023-11-16 DIAGNOSIS — F332 Major depressive disorder, recurrent severe without psychotic features: Secondary | ICD-10-CM | POA: Diagnosis not present

## 2023-11-21 DIAGNOSIS — R110 Nausea: Secondary | ICD-10-CM | POA: Diagnosis not present

## 2023-11-21 DIAGNOSIS — M79604 Pain in right leg: Secondary | ICD-10-CM | POA: Diagnosis not present

## 2023-11-23 DIAGNOSIS — F900 Attention-deficit hyperactivity disorder, predominantly inattentive type: Secondary | ICD-10-CM | POA: Diagnosis not present

## 2023-11-23 DIAGNOSIS — M722 Plantar fascial fibromatosis: Secondary | ICD-10-CM | POA: Diagnosis not present

## 2023-11-23 DIAGNOSIS — F332 Major depressive disorder, recurrent severe without psychotic features: Secondary | ICD-10-CM | POA: Diagnosis not present

## 2023-11-23 DIAGNOSIS — M79604 Pain in right leg: Secondary | ICD-10-CM | POA: Diagnosis not present

## 2023-11-23 DIAGNOSIS — F411 Generalized anxiety disorder: Secondary | ICD-10-CM | POA: Diagnosis not present

## 2023-11-27 DIAGNOSIS — M79604 Pain in right leg: Secondary | ICD-10-CM | POA: Diagnosis not present

## 2023-11-28 DIAGNOSIS — Z1211 Encounter for screening for malignant neoplasm of colon: Secondary | ICD-10-CM | POA: Diagnosis not present

## 2023-11-28 DIAGNOSIS — Z0000 Encounter for general adult medical examination without abnormal findings: Secondary | ICD-10-CM | POA: Diagnosis not present

## 2023-11-28 DIAGNOSIS — J309 Allergic rhinitis, unspecified: Secondary | ICD-10-CM | POA: Diagnosis not present

## 2023-11-28 DIAGNOSIS — R634 Abnormal weight loss: Secondary | ICD-10-CM | POA: Diagnosis not present

## 2023-11-29 DIAGNOSIS — F411 Generalized anxiety disorder: Secondary | ICD-10-CM | POA: Diagnosis not present

## 2023-11-29 DIAGNOSIS — F332 Major depressive disorder, recurrent severe without psychotic features: Secondary | ICD-10-CM | POA: Diagnosis not present

## 2023-11-29 DIAGNOSIS — F900 Attention-deficit hyperactivity disorder, predominantly inattentive type: Secondary | ICD-10-CM | POA: Diagnosis not present

## 2023-11-30 DIAGNOSIS — F900 Attention-deficit hyperactivity disorder, predominantly inattentive type: Secondary | ICD-10-CM | POA: Diagnosis not present

## 2023-11-30 DIAGNOSIS — F411 Generalized anxiety disorder: Secondary | ICD-10-CM | POA: Diagnosis not present

## 2023-11-30 DIAGNOSIS — F332 Major depressive disorder, recurrent severe without psychotic features: Secondary | ICD-10-CM | POA: Diagnosis not present

## 2023-12-04 DIAGNOSIS — F411 Generalized anxiety disorder: Secondary | ICD-10-CM | POA: Diagnosis not present

## 2023-12-04 DIAGNOSIS — F332 Major depressive disorder, recurrent severe without psychotic features: Secondary | ICD-10-CM | POA: Diagnosis not present

## 2023-12-04 DIAGNOSIS — F900 Attention-deficit hyperactivity disorder, predominantly inattentive type: Secondary | ICD-10-CM | POA: Diagnosis not present

## 2023-12-07 DIAGNOSIS — F411 Generalized anxiety disorder: Secondary | ICD-10-CM | POA: Diagnosis not present

## 2023-12-07 DIAGNOSIS — F900 Attention-deficit hyperactivity disorder, predominantly inattentive type: Secondary | ICD-10-CM | POA: Diagnosis not present

## 2023-12-07 DIAGNOSIS — F332 Major depressive disorder, recurrent severe without psychotic features: Secondary | ICD-10-CM | POA: Diagnosis not present

## 2023-12-12 DIAGNOSIS — F411 Generalized anxiety disorder: Secondary | ICD-10-CM | POA: Diagnosis not present

## 2023-12-12 DIAGNOSIS — F900 Attention-deficit hyperactivity disorder, predominantly inattentive type: Secondary | ICD-10-CM | POA: Diagnosis not present

## 2023-12-12 DIAGNOSIS — F332 Major depressive disorder, recurrent severe without psychotic features: Secondary | ICD-10-CM | POA: Diagnosis not present

## 2023-12-14 DIAGNOSIS — F411 Generalized anxiety disorder: Secondary | ICD-10-CM | POA: Diagnosis not present

## 2023-12-14 DIAGNOSIS — F332 Major depressive disorder, recurrent severe without psychotic features: Secondary | ICD-10-CM | POA: Diagnosis not present

## 2023-12-14 DIAGNOSIS — F900 Attention-deficit hyperactivity disorder, predominantly inattentive type: Secondary | ICD-10-CM | POA: Diagnosis not present

## 2023-12-19 DIAGNOSIS — F411 Generalized anxiety disorder: Secondary | ICD-10-CM | POA: Diagnosis not present

## 2023-12-19 DIAGNOSIS — F900 Attention-deficit hyperactivity disorder, predominantly inattentive type: Secondary | ICD-10-CM | POA: Diagnosis not present

## 2023-12-19 DIAGNOSIS — F332 Major depressive disorder, recurrent severe without psychotic features: Secondary | ICD-10-CM | POA: Diagnosis not present

## 2023-12-21 DIAGNOSIS — F411 Generalized anxiety disorder: Secondary | ICD-10-CM | POA: Diagnosis not present

## 2023-12-21 DIAGNOSIS — F332 Major depressive disorder, recurrent severe without psychotic features: Secondary | ICD-10-CM | POA: Diagnosis not present

## 2023-12-21 DIAGNOSIS — F900 Attention-deficit hyperactivity disorder, predominantly inattentive type: Secondary | ICD-10-CM | POA: Diagnosis not present

## 2023-12-28 DIAGNOSIS — F900 Attention-deficit hyperactivity disorder, predominantly inattentive type: Secondary | ICD-10-CM | POA: Diagnosis not present

## 2023-12-28 DIAGNOSIS — F332 Major depressive disorder, recurrent severe without psychotic features: Secondary | ICD-10-CM | POA: Diagnosis not present

## 2023-12-28 DIAGNOSIS — F411 Generalized anxiety disorder: Secondary | ICD-10-CM | POA: Diagnosis not present

## 2023-12-30 DIAGNOSIS — F411 Generalized anxiety disorder: Secondary | ICD-10-CM | POA: Diagnosis not present

## 2023-12-30 DIAGNOSIS — F332 Major depressive disorder, recurrent severe without psychotic features: Secondary | ICD-10-CM | POA: Diagnosis not present

## 2023-12-30 DIAGNOSIS — F900 Attention-deficit hyperactivity disorder, predominantly inattentive type: Secondary | ICD-10-CM | POA: Diagnosis not present

## 2024-01-01 DIAGNOSIS — F411 Generalized anxiety disorder: Secondary | ICD-10-CM | POA: Diagnosis not present

## 2024-01-01 DIAGNOSIS — F332 Major depressive disorder, recurrent severe without psychotic features: Secondary | ICD-10-CM | POA: Diagnosis not present

## 2024-01-01 DIAGNOSIS — F900 Attention-deficit hyperactivity disorder, predominantly inattentive type: Secondary | ICD-10-CM | POA: Diagnosis not present

## 2024-01-11 DIAGNOSIS — F411 Generalized anxiety disorder: Secondary | ICD-10-CM | POA: Diagnosis not present

## 2024-01-11 DIAGNOSIS — F332 Major depressive disorder, recurrent severe without psychotic features: Secondary | ICD-10-CM | POA: Diagnosis not present

## 2024-01-11 DIAGNOSIS — F900 Attention-deficit hyperactivity disorder, predominantly inattentive type: Secondary | ICD-10-CM | POA: Diagnosis not present

## 2024-01-15 DIAGNOSIS — F411 Generalized anxiety disorder: Secondary | ICD-10-CM | POA: Diagnosis not present

## 2024-01-15 DIAGNOSIS — F900 Attention-deficit hyperactivity disorder, predominantly inattentive type: Secondary | ICD-10-CM | POA: Diagnosis not present

## 2024-01-15 DIAGNOSIS — F332 Major depressive disorder, recurrent severe without psychotic features: Secondary | ICD-10-CM | POA: Diagnosis not present

## 2024-01-18 DIAGNOSIS — F332 Major depressive disorder, recurrent severe without psychotic features: Secondary | ICD-10-CM | POA: Diagnosis not present

## 2024-01-18 DIAGNOSIS — F411 Generalized anxiety disorder: Secondary | ICD-10-CM | POA: Diagnosis not present

## 2024-01-18 DIAGNOSIS — F900 Attention-deficit hyperactivity disorder, predominantly inattentive type: Secondary | ICD-10-CM | POA: Diagnosis not present

## 2024-01-22 DIAGNOSIS — F332 Major depressive disorder, recurrent severe without psychotic features: Secondary | ICD-10-CM | POA: Diagnosis not present

## 2024-01-22 DIAGNOSIS — F411 Generalized anxiety disorder: Secondary | ICD-10-CM | POA: Diagnosis not present

## 2024-01-22 DIAGNOSIS — F900 Attention-deficit hyperactivity disorder, predominantly inattentive type: Secondary | ICD-10-CM | POA: Diagnosis not present

## 2024-01-25 DIAGNOSIS — F900 Attention-deficit hyperactivity disorder, predominantly inattentive type: Secondary | ICD-10-CM | POA: Diagnosis not present

## 2024-01-25 DIAGNOSIS — F332 Major depressive disorder, recurrent severe without psychotic features: Secondary | ICD-10-CM | POA: Diagnosis not present

## 2024-01-25 DIAGNOSIS — F411 Generalized anxiety disorder: Secondary | ICD-10-CM | POA: Diagnosis not present

## 2024-01-29 DIAGNOSIS — R0981 Nasal congestion: Secondary | ICD-10-CM | POA: Diagnosis not present

## 2024-01-29 DIAGNOSIS — G479 Sleep disorder, unspecified: Secondary | ICD-10-CM | POA: Diagnosis not present

## 2024-01-29 DIAGNOSIS — Z113 Encounter for screening for infections with a predominantly sexual mode of transmission: Secondary | ICD-10-CM | POA: Diagnosis not present

## 2024-02-01 DIAGNOSIS — F900 Attention-deficit hyperactivity disorder, predominantly inattentive type: Secondary | ICD-10-CM | POA: Diagnosis not present

## 2024-02-01 DIAGNOSIS — F332 Major depressive disorder, recurrent severe without psychotic features: Secondary | ICD-10-CM | POA: Diagnosis not present

## 2024-02-01 DIAGNOSIS — F411 Generalized anxiety disorder: Secondary | ICD-10-CM | POA: Diagnosis not present

## 2024-02-01 IMAGING — DX DG KNEE COMPLETE 4+V*L*
4 series · 4 of 4 positions shown · non-contrast
Comparison: None.

CLINICAL DATA: Leg injury, laceration.

EXAM:
LEFT KNEE - COMPLETE 4+ VIEW

[knee ap]
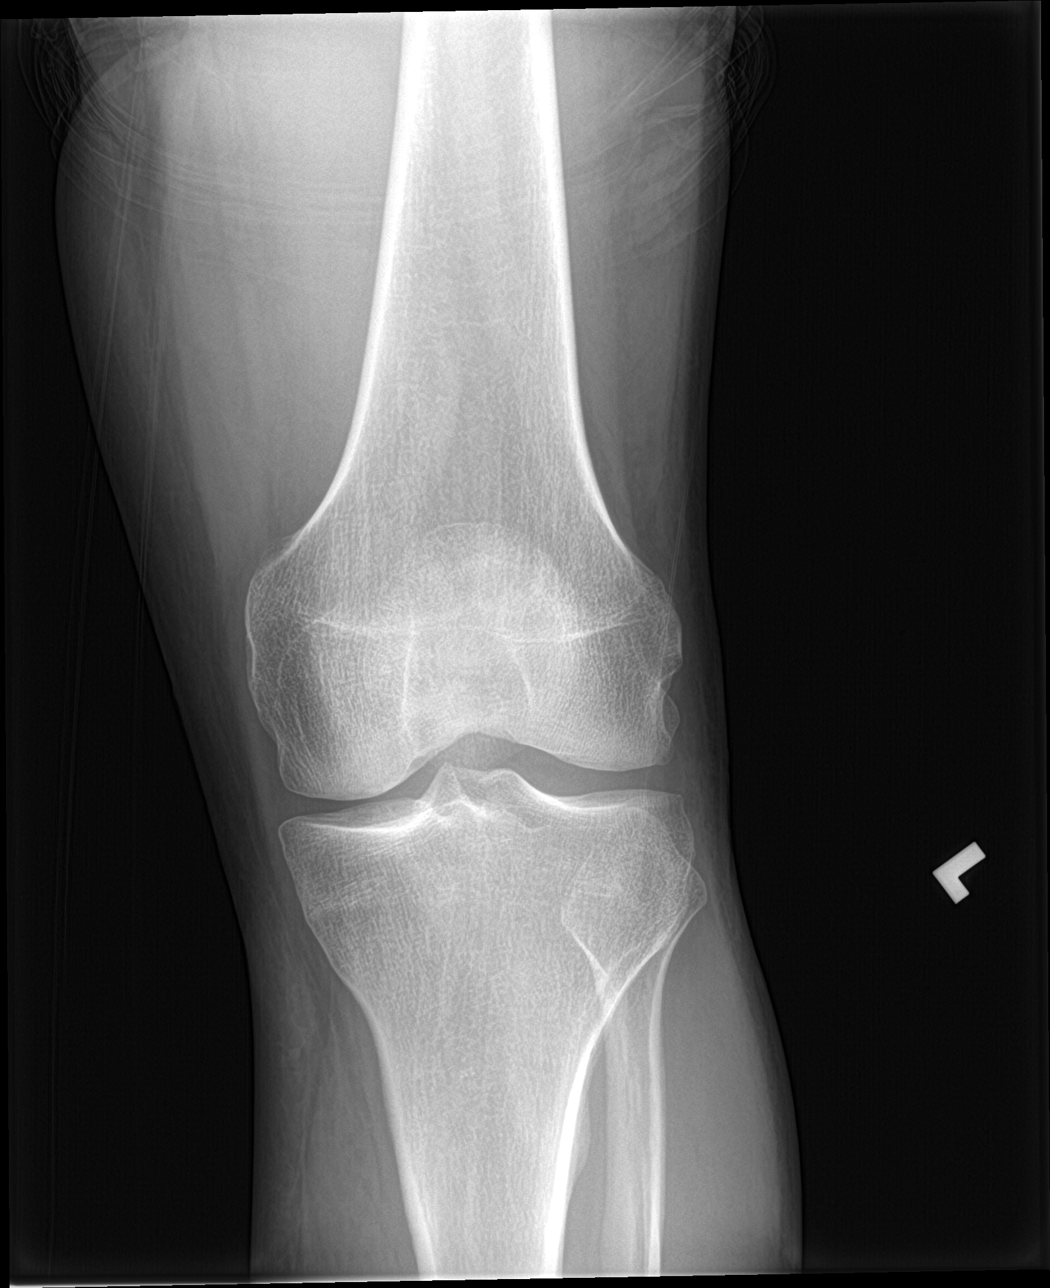

[knee lat]
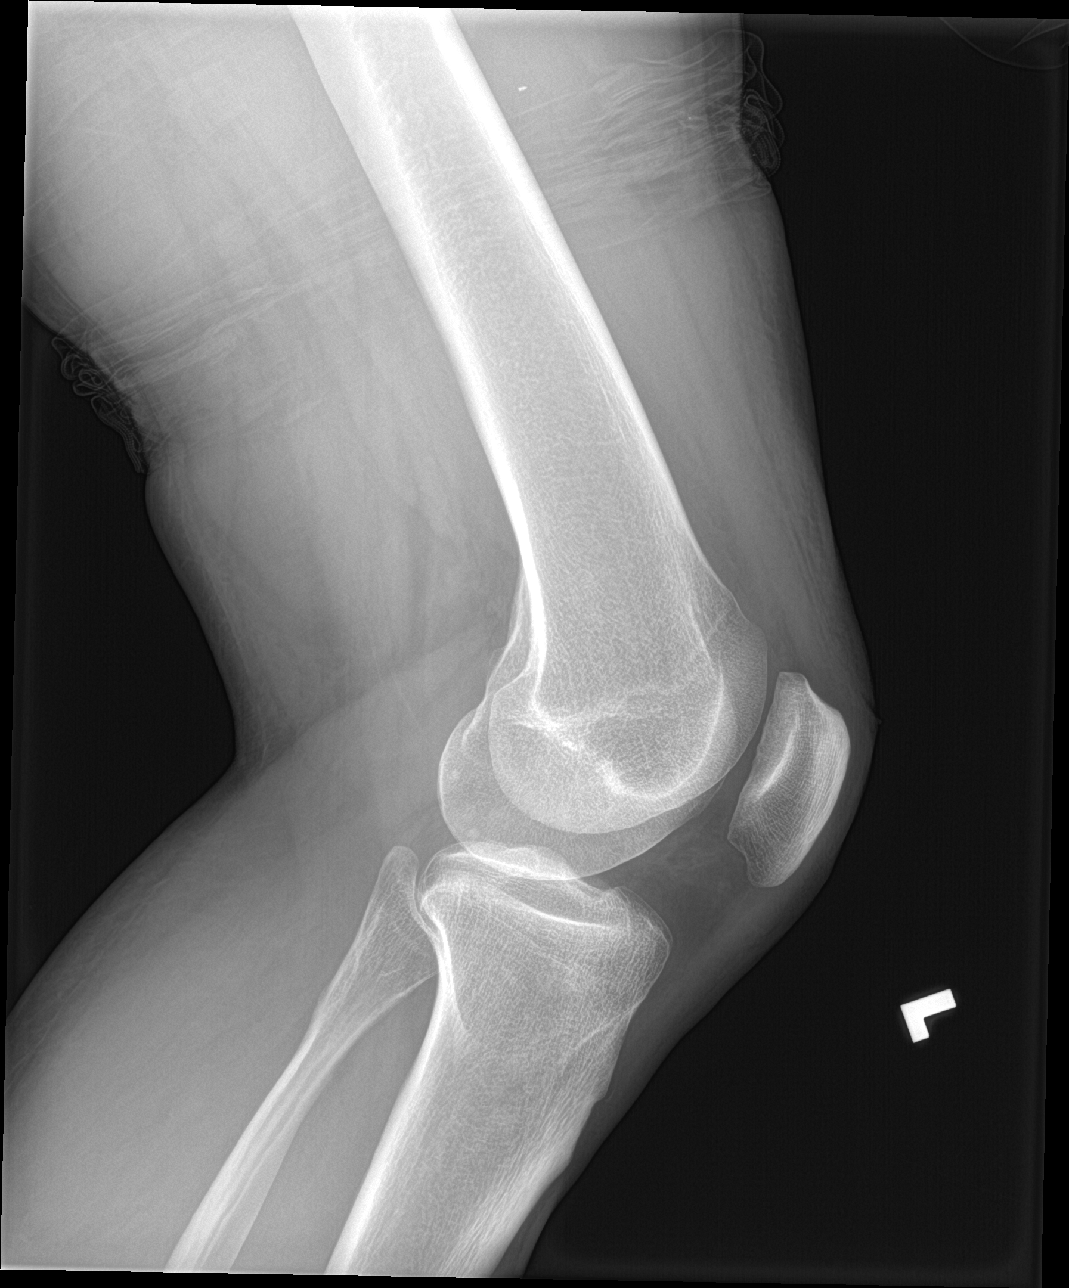

[knee obl (1 of 2)]
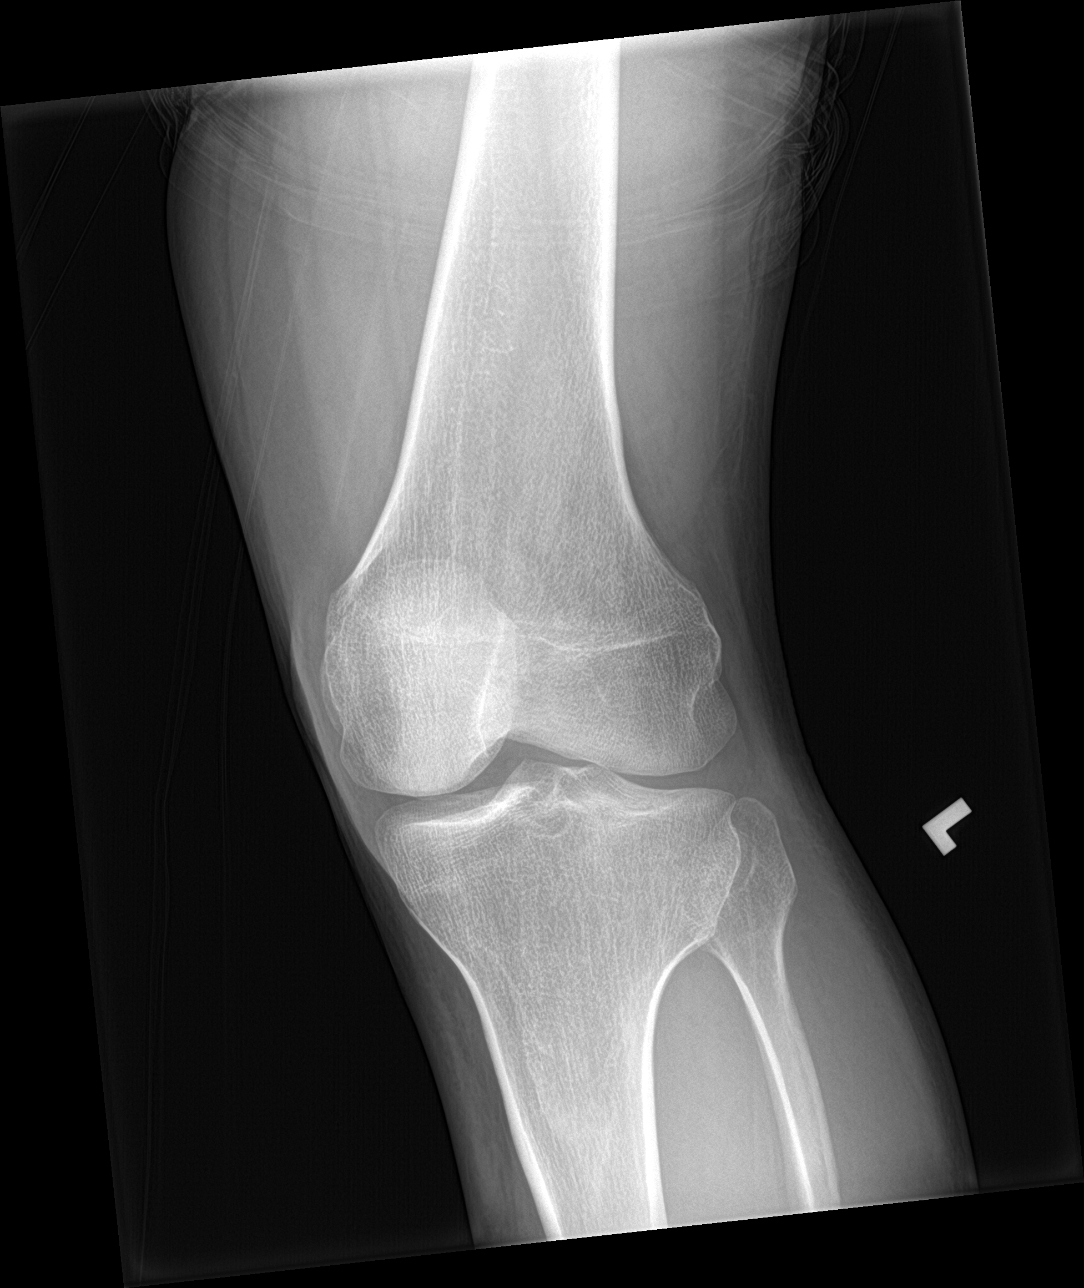

[knee obl (2 of 2)]
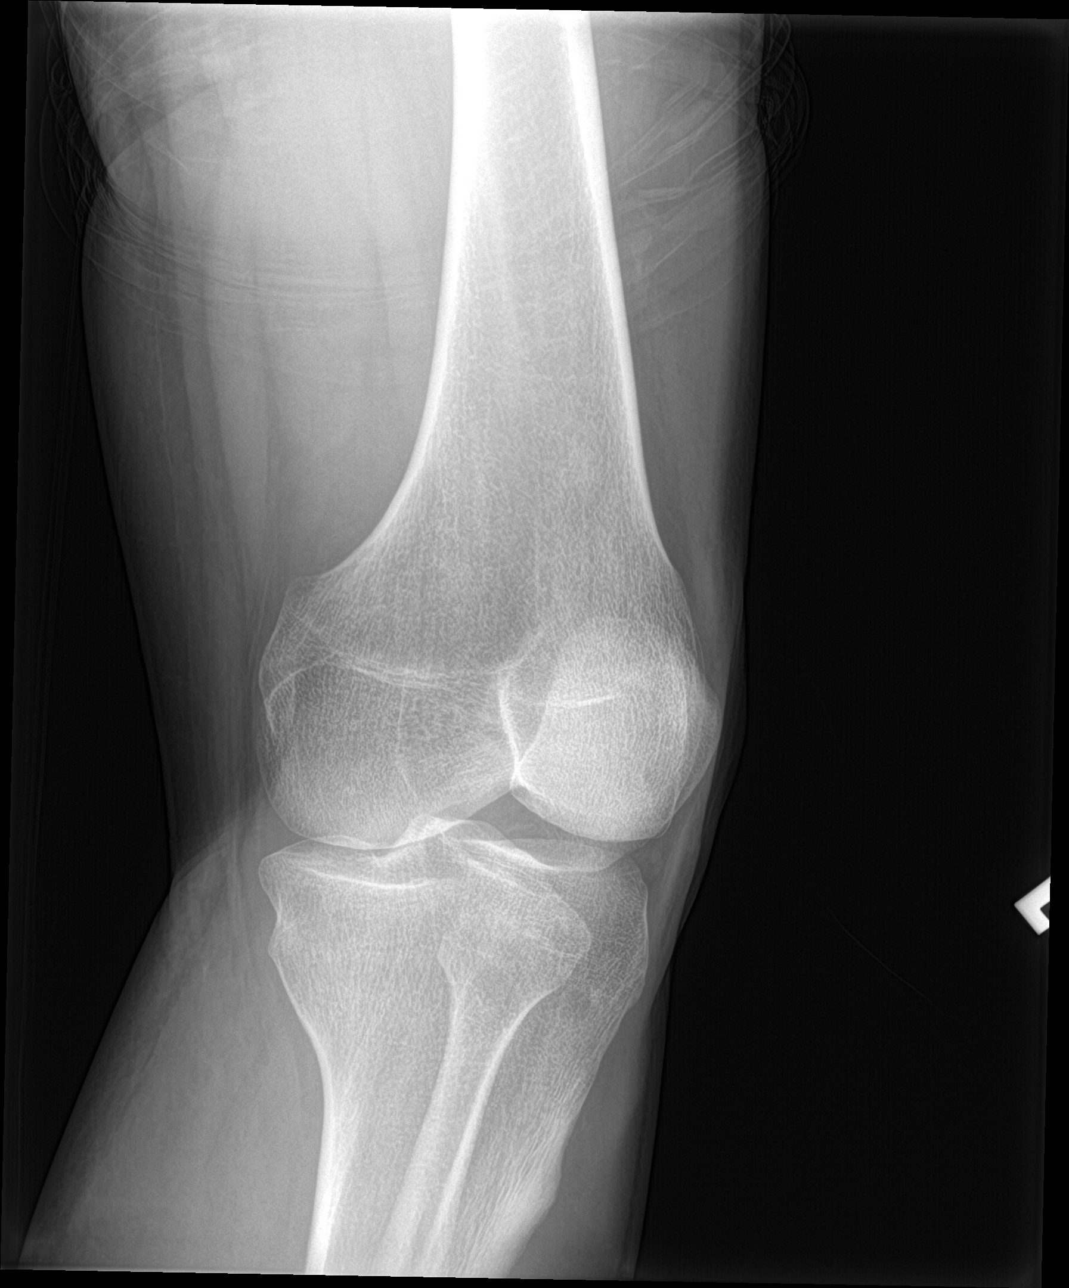

[4 of 4 positions shown; findings below may reference images not displayed]

FINDINGS: No evidence of fracture, dislocation, or joint effusion. No evidence
of arthropathy or other focal bone abnormality. Skin defect
anteriorly above the patella likely site of laceration, mild
associated skin thickening. There is no tracking soft tissue gas or
radiopaque foreign body.
IMPRESSION: Skin defect anteriorly above the patella likely site of laceration.
No radiopaque foreign body or acute osseous abnormality.

## 2024-02-06 DIAGNOSIS — F900 Attention-deficit hyperactivity disorder, predominantly inattentive type: Secondary | ICD-10-CM | POA: Diagnosis not present

## 2024-02-06 DIAGNOSIS — F332 Major depressive disorder, recurrent severe without psychotic features: Secondary | ICD-10-CM | POA: Diagnosis not present

## 2024-02-06 DIAGNOSIS — F411 Generalized anxiety disorder: Secondary | ICD-10-CM | POA: Diagnosis not present

## 2024-02-08 DIAGNOSIS — F900 Attention-deficit hyperactivity disorder, predominantly inattentive type: Secondary | ICD-10-CM | POA: Diagnosis not present

## 2024-02-08 DIAGNOSIS — F411 Generalized anxiety disorder: Secondary | ICD-10-CM | POA: Diagnosis not present

## 2024-02-08 DIAGNOSIS — F332 Major depressive disorder, recurrent severe without psychotic features: Secondary | ICD-10-CM | POA: Diagnosis not present

## 2024-02-13 DIAGNOSIS — F411 Generalized anxiety disorder: Secondary | ICD-10-CM | POA: Diagnosis not present

## 2024-02-13 DIAGNOSIS — F332 Major depressive disorder, recurrent severe without psychotic features: Secondary | ICD-10-CM | POA: Diagnosis not present

## 2024-02-13 DIAGNOSIS — F900 Attention-deficit hyperactivity disorder, predominantly inattentive type: Secondary | ICD-10-CM | POA: Diagnosis not present

## 2024-02-20 DIAGNOSIS — G4730 Sleep apnea, unspecified: Secondary | ICD-10-CM | POA: Diagnosis not present

## 2024-02-20 DIAGNOSIS — J0191 Acute recurrent sinusitis, unspecified: Secondary | ICD-10-CM | POA: Diagnosis not present

## 2024-02-20 DIAGNOSIS — J342 Deviated nasal septum: Secondary | ICD-10-CM | POA: Diagnosis not present

## 2024-02-22 DIAGNOSIS — F332 Major depressive disorder, recurrent severe without psychotic features: Secondary | ICD-10-CM | POA: Diagnosis not present

## 2024-02-22 DIAGNOSIS — F411 Generalized anxiety disorder: Secondary | ICD-10-CM | POA: Diagnosis not present

## 2024-02-22 DIAGNOSIS — F900 Attention-deficit hyperactivity disorder, predominantly inattentive type: Secondary | ICD-10-CM | POA: Diagnosis not present

## 2024-02-29 DIAGNOSIS — F332 Major depressive disorder, recurrent severe without psychotic features: Secondary | ICD-10-CM | POA: Diagnosis not present

## 2024-02-29 DIAGNOSIS — F411 Generalized anxiety disorder: Secondary | ICD-10-CM | POA: Diagnosis not present

## 2024-02-29 DIAGNOSIS — F900 Attention-deficit hyperactivity disorder, predominantly inattentive type: Secondary | ICD-10-CM | POA: Diagnosis not present

## 2024-03-05 DIAGNOSIS — F900 Attention-deficit hyperactivity disorder, predominantly inattentive type: Secondary | ICD-10-CM | POA: Diagnosis not present

## 2024-03-05 DIAGNOSIS — F411 Generalized anxiety disorder: Secondary | ICD-10-CM | POA: Diagnosis not present

## 2024-03-05 DIAGNOSIS — F332 Major depressive disorder, recurrent severe without psychotic features: Secondary | ICD-10-CM | POA: Diagnosis not present

## 2024-03-06 ENCOUNTER — Ambulatory Visit (INDEPENDENT_AMBULATORY_CARE_PROVIDER_SITE_OTHER): Admitting: Dermatology

## 2024-03-06 ENCOUNTER — Encounter: Payer: Self-pay | Admitting: Dermatology

## 2024-03-06 DIAGNOSIS — L570 Actinic keratosis: Secondary | ICD-10-CM

## 2024-03-06 DIAGNOSIS — D485 Neoplasm of uncertain behavior of skin: Secondary | ICD-10-CM

## 2024-03-06 DIAGNOSIS — L820 Inflamed seborrheic keratosis: Secondary | ICD-10-CM

## 2024-03-06 DIAGNOSIS — W908XXA Exposure to other nonionizing radiation, initial encounter: Secondary | ICD-10-CM

## 2024-03-06 DIAGNOSIS — D2239 Melanocytic nevi of other parts of face: Secondary | ICD-10-CM

## 2024-03-06 DIAGNOSIS — D492 Neoplasm of unspecified behavior of bone, soft tissue, and skin: Secondary | ICD-10-CM | POA: Diagnosis not present

## 2024-03-06 DIAGNOSIS — L578 Other skin changes due to chronic exposure to nonionizing radiation: Secondary | ICD-10-CM | POA: Diagnosis not present

## 2024-03-06 NOTE — Patient Instructions (Signed)

## 2024-03-06 NOTE — Progress Notes (Signed)
   New Patient Visit   Subjective  Samuel Harrison is a 43 y.o. male who presents for the following: Lesion on the right cheek Patient has had moles removed before that were atypical. He last saw a dermatologist 8 years ago.  Patient works in [employer] and spends a lot of time outside.  Mole on the right cheek that has been present since he was a teenager. It has gradually gotten bigger, but in the past 6 months it has become more irritated and drain.  The patient has spots, moles and lesions to be evaluated, some may be new or changing. Accompanied by girlfriend.   The following portions of the chart were reviewed this encounter and updated as appropriate: medications, allergies, medical history  Review of Systems:  No other skin or systemic complaints except as noted in HPI or Assessment and Plan.  Objective  Well appearing patient in no apparent distress; mood and affect are within normal limits.   A focused examination was performed of the following areas: Face and left arm  Relevant exam findings are noted in the Assessment and Plan.  Right Buccal Cheek 0.8cm irritated pedunculdatd papule  Left Antecubital Fossa Irritated stuck on plan Nose Pigmented gritty papule  Assessment & Plan   ACTINIC DAMAGE - chronic, secondary to cumulative UV radiation exposure/sun exposure over time - diffuse scaly erythematous macules with underlying dyspigmentation - Recommend daily broad spectrum sunscreen SPF 30+ to sun-exposed areas, reapply every 2 hours as needed.  - Recommend staying in the shade or wearing long sleeves, sun glasses (UVA+UVB protection) and wide brim hats (4-inch brim around the entire circumference of the hat). - Call for new or changing lesions.  NEOPLASM OF UNCERTAIN BEHAVIOR OF SKIN Right Buccal Cheek Epidermal / dermal shaving  Lesion diameter (cm):  0.8 Informed consent: discussed and consent obtained   Timeout: patient name, date of birth, surgical site,  and procedure verified   Anesthesia: the lesion was anesthetized in a standard fashion   Anesthetic:  1% lidocaine  w/ epinephrine 1-100,000 buffered w/ 8.4% NaHCO3 Instrument used: DermaBlade   Hemostasis achieved with: aluminum chloride   Outcome: patient tolerated procedure well   Post-procedure details: sterile dressing applied and wound care instructions given   Dressing type: bandage and petrolatum   Specimen 1 - Surgical pathology Differential Diagnosis: r/o nevus vs other  Check Margins: No INFLAMED SEBORRHEIC KERATOSIS Left Antecubital Fossa Destruction of lesion - Left Antecubital Fossa Complexity: simple   Destruction method: cryotherapy   Lesion destroyed using liquid nitrogen: Yes   Region frozen until ice ball extended beyond lesion: Yes   Cryotherapy cycles:  1 Post-procedure details: wound care instructions given   PIGMENTED ACTINIC KERATOSIS Nose Destruction of lesion - Nose Complexity: simple   Destruction method: cryotherapy   Lesion destroyed using liquid nitrogen: Yes   Region frozen until ice ball extended beyond lesion: Yes   Cryotherapy cycles:  1 Outcome: patient tolerated procedure well with no complications   Post-procedure details: wound care instructions given   ACTINIC SKIN DAMAGE    Return for full body skin exam with Erminio.  LILLETTE Rollene Gobble, RN, am acting as scribe for RUFUS CHRISTELLA HOLY, MD .   Documentation: I have reviewed the above documentation for accuracy and completeness, and I agree with the above.  RUFUS CHRISTELLA HOLY, MD

## 2024-03-07 ENCOUNTER — Ambulatory Visit: Payer: Self-pay | Admitting: Dermatology

## 2024-03-07 DIAGNOSIS — F411 Generalized anxiety disorder: Secondary | ICD-10-CM | POA: Diagnosis not present

## 2024-03-07 DIAGNOSIS — F900 Attention-deficit hyperactivity disorder, predominantly inattentive type: Secondary | ICD-10-CM | POA: Diagnosis not present

## 2024-03-07 DIAGNOSIS — F332 Major depressive disorder, recurrent severe without psychotic features: Secondary | ICD-10-CM | POA: Diagnosis not present

## 2024-03-07 LAB — SURGICAL PATHOLOGY

## 2024-03-07 NOTE — Telephone Encounter (Signed)
-----   Message from [facility] PACI sent at 03/07/2024  3:41 PM EDT ----- Results: Lesion on right cheek results showed benign nevus. Please reassure  patient on benign nature.   ----- Message ----- From: Interface, Lab In Three Zero One Sent: 03/07/2024   2:59 PM EDT To: Rufus CHRISTELLA Holy, MD

## 2024-03-08 DIAGNOSIS — F900 Attention-deficit hyperactivity disorder, predominantly inattentive type: Secondary | ICD-10-CM | POA: Diagnosis not present

## 2024-03-08 DIAGNOSIS — F411 Generalized anxiety disorder: Secondary | ICD-10-CM | POA: Diagnosis not present

## 2024-03-08 DIAGNOSIS — F332 Major depressive disorder, recurrent severe without psychotic features: Secondary | ICD-10-CM | POA: Diagnosis not present

## 2024-03-11 NOTE — Telephone Encounter (Signed)
 L/m for pt with bx results and recommendations

## 2024-03-11 NOTE — Telephone Encounter (Signed)
-----   Message from [facility] PACI sent at 03/07/2024  3:41 PM EDT ----- Results: Lesion on right cheek results showed benign nevus. Please reassure  patient on benign nature.   ----- Message ----- From: Interface, Lab In Three Zero One Sent: 03/07/2024   2:59 PM EDT To: Rufus CHRISTELLA Holy, MD

## 2024-03-12 DIAGNOSIS — F332 Major depressive disorder, recurrent severe without psychotic features: Secondary | ICD-10-CM | POA: Diagnosis not present

## 2024-03-12 DIAGNOSIS — F900 Attention-deficit hyperactivity disorder, predominantly inattentive type: Secondary | ICD-10-CM | POA: Diagnosis not present

## 2024-03-12 DIAGNOSIS — F411 Generalized anxiety disorder: Secondary | ICD-10-CM | POA: Diagnosis not present

## 2024-03-19 DIAGNOSIS — F900 Attention-deficit hyperactivity disorder, predominantly inattentive type: Secondary | ICD-10-CM | POA: Diagnosis not present

## 2024-03-19 DIAGNOSIS — F332 Major depressive disorder, recurrent severe without psychotic features: Secondary | ICD-10-CM | POA: Diagnosis not present

## 2024-03-19 DIAGNOSIS — F411 Generalized anxiety disorder: Secondary | ICD-10-CM | POA: Diagnosis not present

## 2024-03-21 DIAGNOSIS — F332 Major depressive disorder, recurrent severe without psychotic features: Secondary | ICD-10-CM | POA: Diagnosis not present

## 2024-03-21 DIAGNOSIS — F900 Attention-deficit hyperactivity disorder, predominantly inattentive type: Secondary | ICD-10-CM | POA: Diagnosis not present

## 2024-03-21 DIAGNOSIS — F411 Generalized anxiety disorder: Secondary | ICD-10-CM | POA: Diagnosis not present

## 2024-03-25 DIAGNOSIS — F900 Attention-deficit hyperactivity disorder, predominantly inattentive type: Secondary | ICD-10-CM | POA: Diagnosis not present

## 2024-03-25 DIAGNOSIS — F411 Generalized anxiety disorder: Secondary | ICD-10-CM | POA: Diagnosis not present

## 2024-03-25 DIAGNOSIS — F17210 Nicotine dependence, cigarettes, uncomplicated: Secondary | ICD-10-CM | POA: Diagnosis not present

## 2024-03-25 DIAGNOSIS — F332 Major depressive disorder, recurrent severe without psychotic features: Secondary | ICD-10-CM | POA: Diagnosis not present

## 2024-03-25 DIAGNOSIS — R079 Chest pain, unspecified: Secondary | ICD-10-CM | POA: Diagnosis not present

## 2024-03-25 DIAGNOSIS — R0789 Other chest pain: Secondary | ICD-10-CM | POA: Diagnosis not present

## 2024-03-28 DIAGNOSIS — F332 Major depressive disorder, recurrent severe without psychotic features: Secondary | ICD-10-CM | POA: Diagnosis not present

## 2024-03-28 DIAGNOSIS — F411 Generalized anxiety disorder: Secondary | ICD-10-CM | POA: Diagnosis not present

## 2024-03-28 DIAGNOSIS — F900 Attention-deficit hyperactivity disorder, predominantly inattentive type: Secondary | ICD-10-CM | POA: Diagnosis not present

## 2024-04-02 DIAGNOSIS — F332 Major depressive disorder, recurrent severe without psychotic features: Secondary | ICD-10-CM | POA: Diagnosis not present

## 2024-04-02 DIAGNOSIS — F411 Generalized anxiety disorder: Secondary | ICD-10-CM | POA: Diagnosis not present

## 2024-04-02 DIAGNOSIS — F900 Attention-deficit hyperactivity disorder, predominantly inattentive type: Secondary | ICD-10-CM | POA: Diagnosis not present

## 2024-04-04 DIAGNOSIS — F900 Attention-deficit hyperactivity disorder, predominantly inattentive type: Secondary | ICD-10-CM | POA: Diagnosis not present

## 2024-04-04 DIAGNOSIS — F332 Major depressive disorder, recurrent severe without psychotic features: Secondary | ICD-10-CM | POA: Diagnosis not present

## 2024-04-04 DIAGNOSIS — F411 Generalized anxiety disorder: Secondary | ICD-10-CM | POA: Diagnosis not present

## 2024-04-08 DIAGNOSIS — F411 Generalized anxiety disorder: Secondary | ICD-10-CM | POA: Diagnosis not present

## 2024-04-08 DIAGNOSIS — F900 Attention-deficit hyperactivity disorder, predominantly inattentive type: Secondary | ICD-10-CM | POA: Diagnosis not present

## 2024-04-08 DIAGNOSIS — F332 Major depressive disorder, recurrent severe without psychotic features: Secondary | ICD-10-CM | POA: Diagnosis not present

## 2024-04-11 DIAGNOSIS — F332 Major depressive disorder, recurrent severe without psychotic features: Secondary | ICD-10-CM | POA: Diagnosis not present

## 2024-04-11 DIAGNOSIS — F411 Generalized anxiety disorder: Secondary | ICD-10-CM | POA: Diagnosis not present

## 2024-04-11 DIAGNOSIS — F900 Attention-deficit hyperactivity disorder, predominantly inattentive type: Secondary | ICD-10-CM | POA: Diagnosis not present

## 2024-04-16 DIAGNOSIS — F900 Attention-deficit hyperactivity disorder, predominantly inattentive type: Secondary | ICD-10-CM | POA: Diagnosis not present

## 2024-04-16 DIAGNOSIS — F332 Major depressive disorder, recurrent severe without psychotic features: Secondary | ICD-10-CM | POA: Diagnosis not present

## 2024-04-16 DIAGNOSIS — F411 Generalized anxiety disorder: Secondary | ICD-10-CM | POA: Diagnosis not present

## 2024-04-18 DIAGNOSIS — F332 Major depressive disorder, recurrent severe without psychotic features: Secondary | ICD-10-CM | POA: Diagnosis not present

## 2024-04-18 DIAGNOSIS — F900 Attention-deficit hyperactivity disorder, predominantly inattentive type: Secondary | ICD-10-CM | POA: Diagnosis not present

## 2024-04-18 DIAGNOSIS — F411 Generalized anxiety disorder: Secondary | ICD-10-CM | POA: Diagnosis not present

## 2024-04-23 DIAGNOSIS — F900 Attention-deficit hyperactivity disorder, predominantly inattentive type: Secondary | ICD-10-CM | POA: Diagnosis not present

## 2024-04-23 DIAGNOSIS — F411 Generalized anxiety disorder: Secondary | ICD-10-CM | POA: Diagnosis not present

## 2024-04-23 DIAGNOSIS — F332 Major depressive disorder, recurrent severe without psychotic features: Secondary | ICD-10-CM | POA: Diagnosis not present

## 2024-04-25 DIAGNOSIS — F900 Attention-deficit hyperactivity disorder, predominantly inattentive type: Secondary | ICD-10-CM | POA: Diagnosis not present

## 2024-04-25 DIAGNOSIS — F411 Generalized anxiety disorder: Secondary | ICD-10-CM | POA: Diagnosis not present

## 2024-04-25 DIAGNOSIS — F332 Major depressive disorder, recurrent severe without psychotic features: Secondary | ICD-10-CM | POA: Diagnosis not present

## 2024-04-30 DIAGNOSIS — F411 Generalized anxiety disorder: Secondary | ICD-10-CM | POA: Diagnosis not present

## 2024-04-30 DIAGNOSIS — F900 Attention-deficit hyperactivity disorder, predominantly inattentive type: Secondary | ICD-10-CM | POA: Diagnosis not present

## 2024-04-30 DIAGNOSIS — F332 Major depressive disorder, recurrent severe without psychotic features: Secondary | ICD-10-CM | POA: Diagnosis not present

## 2024-05-06 DIAGNOSIS — F411 Generalized anxiety disorder: Secondary | ICD-10-CM | POA: Diagnosis not present

## 2024-05-06 DIAGNOSIS — F900 Attention-deficit hyperactivity disorder, predominantly inattentive type: Secondary | ICD-10-CM | POA: Diagnosis not present

## 2024-05-06 DIAGNOSIS — F332 Major depressive disorder, recurrent severe without psychotic features: Secondary | ICD-10-CM | POA: Diagnosis not present

## 2024-05-09 DIAGNOSIS — F332 Major depressive disorder, recurrent severe without psychotic features: Secondary | ICD-10-CM | POA: Diagnosis not present

## 2024-05-09 DIAGNOSIS — F411 Generalized anxiety disorder: Secondary | ICD-10-CM | POA: Diagnosis not present

## 2024-05-09 DIAGNOSIS — F900 Attention-deficit hyperactivity disorder, predominantly inattentive type: Secondary | ICD-10-CM | POA: Diagnosis not present

## 2024-05-11 DIAGNOSIS — F332 Major depressive disorder, recurrent severe without psychotic features: Secondary | ICD-10-CM | POA: Diagnosis not present

## 2024-05-11 DIAGNOSIS — F411 Generalized anxiety disorder: Secondary | ICD-10-CM | POA: Diagnosis not present

## 2024-05-11 DIAGNOSIS — F900 Attention-deficit hyperactivity disorder, predominantly inattentive type: Secondary | ICD-10-CM | POA: Diagnosis not present

## 2024-05-13 DIAGNOSIS — F332 Major depressive disorder, recurrent severe without psychotic features: Secondary | ICD-10-CM | POA: Diagnosis not present

## 2024-05-13 DIAGNOSIS — F900 Attention-deficit hyperactivity disorder, predominantly inattentive type: Secondary | ICD-10-CM | POA: Diagnosis not present

## 2024-05-13 DIAGNOSIS — F411 Generalized anxiety disorder: Secondary | ICD-10-CM | POA: Diagnosis not present

## 2024-05-16 DIAGNOSIS — F411 Generalized anxiety disorder: Secondary | ICD-10-CM | POA: Diagnosis not present

## 2024-05-16 DIAGNOSIS — F900 Attention-deficit hyperactivity disorder, predominantly inattentive type: Secondary | ICD-10-CM | POA: Diagnosis not present

## 2024-05-16 DIAGNOSIS — F332 Major depressive disorder, recurrent severe without psychotic features: Secondary | ICD-10-CM | POA: Diagnosis not present

## 2024-05-21 IMAGING — DX DG CHEST 1V PORT
1 series · 1 of 1 positions shown · non-contrast
Comparison: Chest radiograph dated 08/31/2015.

CLINICAL DATA: Shortness of breath.

EXAM:
PORTABLE CHEST 1 VIEW

[chest ap]
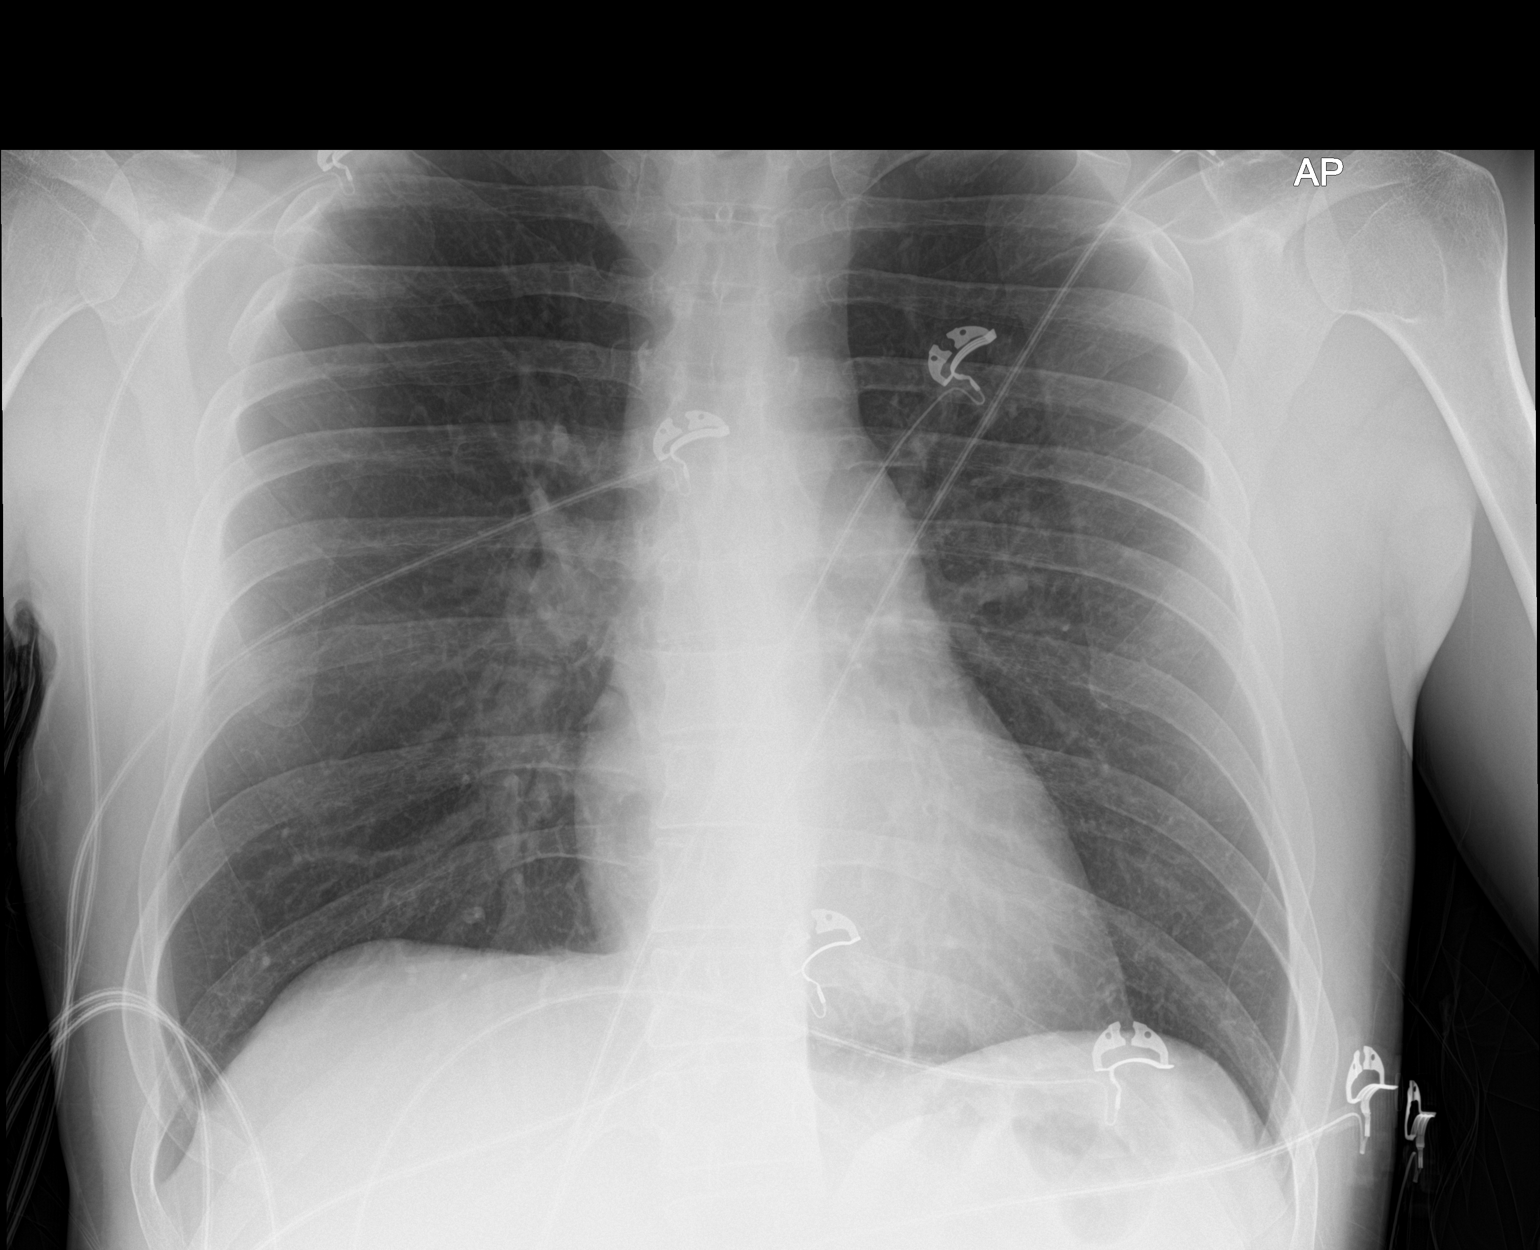

[1 of 1 positions shown; findings below may reference images not displayed]

FINDINGS: The heart size and mediastinal contours are within normal limits.
Both lungs are clear. The visualized skeletal structures are
unremarkable.
IMPRESSION: No active disease.

## 2024-05-23 DIAGNOSIS — F900 Attention-deficit hyperactivity disorder, predominantly inattentive type: Secondary | ICD-10-CM | POA: Diagnosis not present

## 2024-05-23 DIAGNOSIS — F332 Major depressive disorder, recurrent severe without psychotic features: Secondary | ICD-10-CM | POA: Diagnosis not present

## 2024-05-23 DIAGNOSIS — F411 Generalized anxiety disorder: Secondary | ICD-10-CM | POA: Diagnosis not present

## 2024-05-27 DIAGNOSIS — F900 Attention-deficit hyperactivity disorder, predominantly inattentive type: Secondary | ICD-10-CM | POA: Diagnosis not present

## 2024-05-27 DIAGNOSIS — F411 Generalized anxiety disorder: Secondary | ICD-10-CM | POA: Diagnosis not present

## 2024-05-27 DIAGNOSIS — F332 Major depressive disorder, recurrent severe without psychotic features: Secondary | ICD-10-CM | POA: Diagnosis not present

## 2024-05-30 DIAGNOSIS — F332 Major depressive disorder, recurrent severe without psychotic features: Secondary | ICD-10-CM | POA: Diagnosis not present

## 2024-05-30 DIAGNOSIS — F411 Generalized anxiety disorder: Secondary | ICD-10-CM | POA: Diagnosis not present

## 2024-05-30 DIAGNOSIS — F900 Attention-deficit hyperactivity disorder, predominantly inattentive type: Secondary | ICD-10-CM | POA: Diagnosis not present

## 2024-06-06 DIAGNOSIS — F332 Major depressive disorder, recurrent severe without psychotic features: Secondary | ICD-10-CM | POA: Diagnosis not present

## 2024-06-06 DIAGNOSIS — F411 Generalized anxiety disorder: Secondary | ICD-10-CM | POA: Diagnosis not present

## 2024-06-06 DIAGNOSIS — F900 Attention-deficit hyperactivity disorder, predominantly inattentive type: Secondary | ICD-10-CM | POA: Diagnosis not present

## 2024-06-17 DIAGNOSIS — R0683 Snoring: Secondary | ICD-10-CM | POA: Diagnosis not present

## 2024-06-18 DIAGNOSIS — F411 Generalized anxiety disorder: Secondary | ICD-10-CM | POA: Diagnosis not present

## 2024-06-18 DIAGNOSIS — F900 Attention-deficit hyperactivity disorder, predominantly inattentive type: Secondary | ICD-10-CM | POA: Diagnosis not present

## 2024-06-18 DIAGNOSIS — F332 Major depressive disorder, recurrent severe without psychotic features: Secondary | ICD-10-CM | POA: Diagnosis not present

## 2024-06-20 DIAGNOSIS — F332 Major depressive disorder, recurrent severe without psychotic features: Secondary | ICD-10-CM | POA: Diagnosis not present

## 2024-06-20 DIAGNOSIS — F411 Generalized anxiety disorder: Secondary | ICD-10-CM | POA: Diagnosis not present

## 2024-06-20 DIAGNOSIS — F900 Attention-deficit hyperactivity disorder, predominantly inattentive type: Secondary | ICD-10-CM | POA: Diagnosis not present

## 2024-06-27 DIAGNOSIS — F332 Major depressive disorder, recurrent severe without psychotic features: Secondary | ICD-10-CM | POA: Diagnosis not present

## 2024-06-27 DIAGNOSIS — F411 Generalized anxiety disorder: Secondary | ICD-10-CM | POA: Diagnosis not present

## 2024-06-27 DIAGNOSIS — F900 Attention-deficit hyperactivity disorder, predominantly inattentive type: Secondary | ICD-10-CM | POA: Diagnosis not present

## 2024-07-04 DIAGNOSIS — F332 Major depressive disorder, recurrent severe without psychotic features: Secondary | ICD-10-CM | POA: Diagnosis not present

## 2024-07-04 DIAGNOSIS — F411 Generalized anxiety disorder: Secondary | ICD-10-CM | POA: Diagnosis not present

## 2024-07-04 DIAGNOSIS — F900 Attention-deficit hyperactivity disorder, predominantly inattentive type: Secondary | ICD-10-CM | POA: Diagnosis not present

## 2024-07-10 ENCOUNTER — Ambulatory Visit: Admitting: Physician Assistant

## 2024-07-10 ENCOUNTER — Encounter: Payer: Self-pay | Admitting: Physician Assistant

## 2024-07-10 VITALS — BP 105/69

## 2024-07-10 DIAGNOSIS — L821 Other seborrheic keratosis: Secondary | ICD-10-CM

## 2024-07-10 DIAGNOSIS — D225 Melanocytic nevi of trunk: Secondary | ICD-10-CM

## 2024-07-10 DIAGNOSIS — W908XXA Exposure to other nonionizing radiation, initial encounter: Secondary | ICD-10-CM | POA: Diagnosis not present

## 2024-07-10 DIAGNOSIS — Q825 Congenital non-neoplastic nevus: Secondary | ICD-10-CM | POA: Diagnosis not present

## 2024-07-10 DIAGNOSIS — D229 Melanocytic nevi, unspecified: Secondary | ICD-10-CM | POA: Diagnosis not present

## 2024-07-10 DIAGNOSIS — L578 Other skin changes due to chronic exposure to nonionizing radiation: Secondary | ICD-10-CM | POA: Diagnosis not present

## 2024-07-10 DIAGNOSIS — Z1283 Encounter for screening for malignant neoplasm of skin: Secondary | ICD-10-CM | POA: Diagnosis not present

## 2024-07-10 DIAGNOSIS — D1801 Hemangioma of skin and subcutaneous tissue: Secondary | ICD-10-CM

## 2024-07-10 DIAGNOSIS — B372 Candidiasis of skin and nail: Secondary | ICD-10-CM

## 2024-07-10 DIAGNOSIS — D485 Neoplasm of uncertain behavior of skin: Secondary | ICD-10-CM

## 2024-07-10 DIAGNOSIS — L814 Other melanin hyperpigmentation: Secondary | ICD-10-CM

## 2024-07-10 DIAGNOSIS — D235 Other benign neoplasm of skin of trunk: Secondary | ICD-10-CM

## 2024-07-10 MED ORDER — CLINDAMYCIN PHOSPHATE 1 % EX LOTN: TOPICAL_LOTION | Freq: Two times a day (BID) | CUTANEOUS | 1 refills | Status: AC

## 2024-07-10 MED ORDER — ECONAZOLE NITRATE 1 % EX CREA: TOPICAL_CREAM | Freq: Two times a day (BID) | CUTANEOUS | 1 refills | Status: AC | PRN

## 2024-07-10 NOTE — Progress Notes (Signed)
 Follow-Up Visit   Subjective  Samuel Harrison is a 43 y.o. male NEW PATIENT who presents for the following: Skin Cancer Screening and Full Body Skin Exam - History of atypical moles in the past  The patient presents for Total-Body Skin Exam (TBSE) for skin cancer screening and mole check. The patient has spots, moles and lesions to be evaluated, some may be new or changing and the patient may have concern these could be cancer.    The following portions of the chart were reviewed this encounter and updated as appropriate: medications, allergies, medical history  Review of Systems:  No other skin or systemic complaints except as noted in HPI or Assessment and Plan.  Objective  Well appearing patient in no apparent distress; mood and affect are within normal limits.  A full examination was performed including scalp, head, eyes, ears, nose, lips, neck, chest, axillae, abdomen, back, buttocks, bilateral upper extremities, bilateral lower extremities, hands, feet, fingers, toes, fingernails, and toenails. All findings within normal limits unless otherwise noted below.   Relevant physical exam findings are noted in the Assessment and Plan.  Right Upper Back 0.5 cm irregular brown macule  Mid Back 0.6 cm flesh colored papule  Left chest 0.8 cm brown papule   Assessment & Plan   SKIN CANCER SCREENING PERFORMED TODAY.  ACTINIC DAMAGE - Chronic condition, secondary to cumulative UV/sun exposure - diffuse scaly erythematous macules with underlying dyspigmentation - Recommend daily broad spectrum sunscreen SPF 30+ to sun-exposed areas, reapply every 2 hours as needed.  - Staying in the shade or wearing long sleeves, sun glasses (UVA+UVB protection) and wide brim hats (4-inch brim around the entire circumference of the hat) are also recommended for sun protection.  - Call for new or changing lesions.  LENTIGINES, SEBORRHEIC KERATOSES, HEMANGIOMAS - Benign normal skin lesions -  Benign-appearing - Call for any changes  MELANOCYTIC NEVI - Tan-brown and/or pink-flesh-colored symmetric macules and papules - Benign appearing on exam today - Observation - Call clinic for new or changing moles - Recommend daily use of broad spectrum spf 30+ sunscreen to sun-exposed areas.   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  Stable. Observation.  Call clinic for new or changing moles.   Recommend daily use of broad spectrum spf 30+ sunscreen to sun-exposed areas.    ABCDEs of mole observation discussed and patient handout given.  RTC if any changes noted.   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  Treatment Plan: Start econazole 1% and clindamycin 1% cream - Mix together and apply twice daily   NEOPLASM OF UNCERTAIN BEHAVIOR OF SKIN (3) Right Upper Back Epidermal / dermal shaving  Lesion diameter (cm):  0.5 Informed consent: discussed and consent obtained   Timeout: patient name, date of birth, surgical site, and procedure verified   Procedure prep:  Patient was prepped and draped in usual sterile fashion Prep type:  Isopropyl alcohol Anesthesia: the lesion was anesthetized in a standard fashion   Anesthetic:  1% lidocaine  w/ epinephrine 1-100,000 buffered w/ 8.4% NaHCO3 Instrument used: flexible razor blade   Hemostasis achieved with: pressure, aluminum chloride and electrodesiccation   Outcome: patient tolerated procedure well   Post-procedure details: sterile dressing applied and wound care instructions given   Dressing type: bandage and petrolatum    Specimen 1 - Surgical pathology Differential Diagnosis: Nevus vs dysplastic nevus R/O MM  Check Margins: No Mid Back Epidermal /  dermal shaving  Lesion diameter (cm):  0.6 Informed consent: discussed and consent obtained   Timeout: patient name, date of birth, surgical  site, and procedure verified   Procedure prep:  Patient was prepped and draped in usual sterile fashion Prep type:  Isopropyl alcohol Anesthesia: the lesion was anesthetized in a standard fashion   Anesthetic:  1% lidocaine  w/ epinephrine 1-100,000 buffered w/ 8.4% NaHCO3 Instrument used: flexible razor blade   Hemostasis achieved with: pressure, aluminum chloride and electrodesiccation   Outcome: patient tolerated procedure well   Post-procedure details: sterile dressing applied and wound care instructions given   Dressing type: bandage and petrolatum    Specimen 2 - Surgical pathology Differential Diagnosis: Irritated nevus vs other R/O dysplasia  Check Margins: No Left chest Epidermal / dermal shaving  Lesion diameter (cm):  0.8 Informed consent: discussed and consent obtained   Timeout: patient name, date of birth, surgical site, and procedure verified   Procedure prep:  Patient was prepped and draped in usual sterile fashion Prep type:  Isopropyl alcohol Anesthesia: the lesion was anesthetized in a standard fashion   Anesthetic:  1% lidocaine  w/ epinephrine 1-100,000 buffered w/ 8.4% NaHCO3 Instrument used: flexible razor blade   Hemostasis achieved with: pressure, aluminum chloride and electrodesiccation   Outcome: patient tolerated procedure well   Post-procedure details: sterile dressing applied and wound care instructions given   Dressing type: bandage and petrolatum    Specimen 3 - Surgical pathology Differential Diagnosis: Nevus vs Accessory nipple vs other   Check Margins: No SCREENING EXAM FOR SKIN CANCER   ACTINIC SKIN DAMAGE   LENTIGINES   SEBORRHEIC KERATOSIS   CHERRY ANGIOMA   MULTIPLE BENIGN NEVI   CONGENITAL NON-NEOPLASTIC NEVUS   EROSIO INTERDIGITALIS BLASTOMYCETICA      Return in about 1 year (around 07/10/2025) for TBSE.  I, Roseline Hutchinson, CMA, am acting as scribe for Jaise Moser K, PA-C .   Documentation: I have reviewed the  above documentation for accuracy and completeness, and I agree with the above.  Shamonique Battiste K, PA-C

## 2024-07-10 NOTE — Patient Instructions (Addendum)

## 2024-07-11 DIAGNOSIS — F900 Attention-deficit hyperactivity disorder, predominantly inattentive type: Secondary | ICD-10-CM | POA: Diagnosis not present

## 2024-07-11 DIAGNOSIS — F332 Major depressive disorder, recurrent severe without psychotic features: Secondary | ICD-10-CM | POA: Diagnosis not present

## 2024-07-11 DIAGNOSIS — F411 Generalized anxiety disorder: Secondary | ICD-10-CM | POA: Diagnosis not present

## 2024-07-12 DIAGNOSIS — F900 Attention-deficit hyperactivity disorder, predominantly inattentive type: Secondary | ICD-10-CM | POA: Diagnosis not present

## 2024-07-12 DIAGNOSIS — F332 Major depressive disorder, recurrent severe without psychotic features: Secondary | ICD-10-CM | POA: Diagnosis not present

## 2024-07-12 DIAGNOSIS — F411 Generalized anxiety disorder: Secondary | ICD-10-CM | POA: Diagnosis not present

## 2024-07-12 LAB — SURGICAL PATHOLOGY

## 2024-07-15 ENCOUNTER — Ambulatory Visit: Payer: Self-pay | Admitting: Physician Assistant

## 2024-07-19 NOTE — Addendum Note (Signed)
 Addended by: ORMAN AMERICA on: 07/19/2024 07:06 PM   Modules accepted: Level of Service

## 2024-07-23 DIAGNOSIS — F411 Generalized anxiety disorder: Secondary | ICD-10-CM | POA: Diagnosis not present

## 2024-07-23 DIAGNOSIS — F900 Attention-deficit hyperactivity disorder, predominantly inattentive type: Secondary | ICD-10-CM | POA: Diagnosis not present

## 2024-07-23 DIAGNOSIS — F332 Major depressive disorder, recurrent severe without psychotic features: Secondary | ICD-10-CM | POA: Diagnosis not present

## 2024-07-25 DIAGNOSIS — F900 Attention-deficit hyperactivity disorder, predominantly inattentive type: Secondary | ICD-10-CM | POA: Diagnosis not present

## 2024-07-25 DIAGNOSIS — F411 Generalized anxiety disorder: Secondary | ICD-10-CM | POA: Diagnosis not present

## 2024-07-25 DIAGNOSIS — F332 Major depressive disorder, recurrent severe without psychotic features: Secondary | ICD-10-CM | POA: Diagnosis not present

## 2024-07-30 DIAGNOSIS — G4733 Obstructive sleep apnea (adult) (pediatric): Secondary | ICD-10-CM | POA: Diagnosis not present

## 2024-08-01 DIAGNOSIS — F411 Generalized anxiety disorder: Secondary | ICD-10-CM | POA: Diagnosis not present

## 2024-08-01 DIAGNOSIS — F332 Major depressive disorder, recurrent severe without psychotic features: Secondary | ICD-10-CM | POA: Diagnosis not present

## 2024-08-01 DIAGNOSIS — F900 Attention-deficit hyperactivity disorder, predominantly inattentive type: Secondary | ICD-10-CM | POA: Diagnosis not present

## 2024-08-05 DIAGNOSIS — F900 Attention-deficit hyperactivity disorder, predominantly inattentive type: Secondary | ICD-10-CM | POA: Diagnosis not present

## 2024-08-05 DIAGNOSIS — F332 Major depressive disorder, recurrent severe without psychotic features: Secondary | ICD-10-CM | POA: Diagnosis not present

## 2024-08-05 DIAGNOSIS — F411 Generalized anxiety disorder: Secondary | ICD-10-CM | POA: Diagnosis not present

## 2024-08-12 DIAGNOSIS — F411 Generalized anxiety disorder: Secondary | ICD-10-CM | POA: Diagnosis not present

## 2024-08-12 DIAGNOSIS — F332 Major depressive disorder, recurrent severe without psychotic features: Secondary | ICD-10-CM | POA: Diagnosis not present

## 2024-08-12 DIAGNOSIS — F900 Attention-deficit hyperactivity disorder, predominantly inattentive type: Secondary | ICD-10-CM | POA: Diagnosis not present

## 2024-08-14 DIAGNOSIS — G4733 Obstructive sleep apnea (adult) (pediatric): Secondary | ICD-10-CM | POA: Diagnosis not present

## 2024-08-15 DIAGNOSIS — F332 Major depressive disorder, recurrent severe without psychotic features: Secondary | ICD-10-CM | POA: Diagnosis not present

## 2024-08-15 DIAGNOSIS — F900 Attention-deficit hyperactivity disorder, predominantly inattentive type: Secondary | ICD-10-CM | POA: Diagnosis not present

## 2024-08-15 DIAGNOSIS — F411 Generalized anxiety disorder: Secondary | ICD-10-CM | POA: Diagnosis not present

## 2024-08-19 DIAGNOSIS — G4733 Obstructive sleep apnea (adult) (pediatric): Secondary | ICD-10-CM | POA: Diagnosis not present

## 2024-08-22 DIAGNOSIS — F332 Major depressive disorder, recurrent severe without psychotic features: Secondary | ICD-10-CM | POA: Diagnosis not present

## 2024-08-22 DIAGNOSIS — F411 Generalized anxiety disorder: Secondary | ICD-10-CM | POA: Diagnosis not present

## 2024-08-22 DIAGNOSIS — F900 Attention-deficit hyperactivity disorder, predominantly inattentive type: Secondary | ICD-10-CM | POA: Diagnosis not present

## 2024-08-23 DIAGNOSIS — J34829 Nasal valve collapse, unspecified: Secondary | ICD-10-CM | POA: Diagnosis not present

## 2024-08-23 DIAGNOSIS — J342 Deviated nasal septum: Secondary | ICD-10-CM | POA: Diagnosis not present

## 2024-08-23 DIAGNOSIS — G4733 Obstructive sleep apnea (adult) (pediatric): Secondary | ICD-10-CM | POA: Diagnosis not present

## 2024-08-23 DIAGNOSIS — J0191 Acute recurrent sinusitis, unspecified: Secondary | ICD-10-CM | POA: Diagnosis not present

## 2024-08-26 DIAGNOSIS — F900 Attention-deficit hyperactivity disorder, predominantly inattentive type: Secondary | ICD-10-CM | POA: Diagnosis not present

## 2024-08-26 DIAGNOSIS — F411 Generalized anxiety disorder: Secondary | ICD-10-CM | POA: Diagnosis not present

## 2024-08-27 DIAGNOSIS — J343 Hypertrophy of nasal turbinates: Secondary | ICD-10-CM | POA: Diagnosis not present

## 2024-08-27 DIAGNOSIS — J342 Deviated nasal septum: Secondary | ICD-10-CM | POA: Diagnosis not present

## 2024-08-27 DIAGNOSIS — J34829 Nasal valve collapse, unspecified: Secondary | ICD-10-CM | POA: Diagnosis not present

## 2024-08-27 DIAGNOSIS — J0191 Acute recurrent sinusitis, unspecified: Secondary | ICD-10-CM | POA: Diagnosis not present

## 2024-08-27 DIAGNOSIS — Z720 Tobacco use: Secondary | ICD-10-CM | POA: Diagnosis not present

## 2024-08-27 DIAGNOSIS — G4733 Obstructive sleep apnea (adult) (pediatric): Secondary | ICD-10-CM | POA: Diagnosis not present

## 2024-08-29 DIAGNOSIS — F900 Attention-deficit hyperactivity disorder, predominantly inattentive type: Secondary | ICD-10-CM | POA: Diagnosis not present

## 2024-08-29 DIAGNOSIS — F332 Major depressive disorder, recurrent severe without psychotic features: Secondary | ICD-10-CM | POA: Diagnosis not present

## 2024-08-29 DIAGNOSIS — F411 Generalized anxiety disorder: Secondary | ICD-10-CM | POA: Diagnosis not present

## 2024-09-19 ENCOUNTER — Encounter: Admitting: Dermatology

## 2025-07-15 ENCOUNTER — Ambulatory Visit: Admitting: Physician Assistant
